# Patient Record
Sex: Male | Born: 1955 | Race: White | Hispanic: No | Marital: Married | Smoking: Never smoker
Health system: Southern US, Community
[De-identification: ages and names within clinical notes are randomized; demographics above are authoritative.]

## PROBLEM LIST (undated history)

## (undated) DIAGNOSIS — K635 Polyp of colon: Secondary | ICD-10-CM

## (undated) DIAGNOSIS — K5792 Diverticulitis of intestine, part unspecified, without perforation or abscess without bleeding: Secondary | ICD-10-CM

## (undated) DIAGNOSIS — C801 Malignant (primary) neoplasm, unspecified: Secondary | ICD-10-CM

## (undated) DIAGNOSIS — R011 Cardiac murmur, unspecified: Secondary | ICD-10-CM

## (undated) DIAGNOSIS — C61 Malignant neoplasm of prostate: Secondary | ICD-10-CM

## (undated) DIAGNOSIS — K219 Gastro-esophageal reflux disease without esophagitis: Secondary | ICD-10-CM

## (undated) DIAGNOSIS — K759 Inflammatory liver disease, unspecified: Secondary | ICD-10-CM

## (undated) DIAGNOSIS — R519 Headache, unspecified: Secondary | ICD-10-CM

## (undated) HISTORY — DX: Diverticulitis of intestine, part unspecified, without perforation or abscess without bleeding: K57.92

## (undated) HISTORY — PX: MELANOMA EXCISION: SHX5266

## (undated) HISTORY — PX: OTHER SURGICAL HISTORY: SHX169

## (undated) HISTORY — PX: COLONOSCOPY: SHX174

## (undated) HISTORY — PX: WISDOM TOOTH EXTRACTION: SHX21

## (undated) HISTORY — DX: Headache, unspecified: R51.9

## (undated) HISTORY — DX: Polyp of colon: K63.5

---

## 1998-08-06 ENCOUNTER — Inpatient Hospital Stay (HOSPITAL_COMMUNITY): Admission: EM | Admit: 1998-08-06 | Discharge: 1998-08-08 | Payer: Self-pay | Admitting: Emergency Medicine

## 1998-08-06 ENCOUNTER — Encounter: Payer: Self-pay | Admitting: *Deleted

## 1998-08-08 ENCOUNTER — Encounter: Payer: Self-pay | Admitting: Cardiology

## 2006-12-08 ENCOUNTER — Ambulatory Visit: Payer: Self-pay | Admitting: Pulmonary Disease

## 2014-05-22 ENCOUNTER — Encounter (HOSPITAL_COMMUNITY): Admission: RE | Payer: Self-pay | Source: Ambulatory Visit

## 2014-05-22 ENCOUNTER — Inpatient Hospital Stay (HOSPITAL_COMMUNITY): Admission: RE | Admit: 2014-05-22 | Payer: Self-pay | Source: Ambulatory Visit | Admitting: Urology

## 2014-05-22 SURGERY — ROBOTIC ASSISTED LAPAROSCOPIC RADICAL PROSTATECTOMY LEVEL 1
Anesthesia: General

## 2014-07-01 ENCOUNTER — Other Ambulatory Visit: Payer: Self-pay | Admitting: Urology

## 2014-07-22 ENCOUNTER — Inpatient Hospital Stay (HOSPITAL_COMMUNITY): Admission: RE | Admit: 2014-07-22 | Payer: Self-pay | Source: Ambulatory Visit

## 2014-07-22 NOTE — H&P (Signed)
  History of Present Illness Mr. Cristian Lewis is a 59 year old gentleman who was found to have an elevated PSA of 4.37 prompting a prostate needle biopsy on 12/25/13 by Dr. Karsten Ro. This revealed Gleason 3+3=6 adenocarcinoma of the prostate in 4 out of 12 biopsy cores. He has a brother with a history of prostate cancer. He has considered his options and has elected to proceed with surgical therapy. He has minimal medical comorbidities.    ** Notably, he is the primary caretaker for his wife who is currently ill with dialysis-dependent end-stage renal disease. She also has had issues related to calciphylaxis and is currently undergoing wound care for a lower extremity wound. She is completely dependent on her husband for her care including her wound care and transportation. 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 He would like to resume work as soon as possible after surgery. This is extremely important to him.    TNM stage: cT1c Nx Mx  PSA: 7.77  Gleason score: 3+3=6  Biopsy (12/25/13): 4/12 cores positive -- L lateral base (30%), R mid (5%), R base (80%), R lateral base (70%)  Prostate volume: 21.7 cc  PSAD: 0.20    Nomogram  OC disease: 58%  EPE: 42%  SVI: 1%  LNI: 1%  PFS (surgery): 95% at 5 years, 91% at 10 years    Urinary function: He has minimal lower urinary tract symptoms. IPSS is 1.   Erectile function: He is not sexually active due to his wife's current medical problems. He therefore is unable to provide a very accurate description of his erectile function. SHIM score is 3 but this is mostly related to inactivity.        Past Medical History Problems  1. History of Diverticulitis (K57.92) 2. History of colonic polyps (Z86.010) 3. History of eczema (Z87.2) 4. History of malignant melanoma (S28.315)  Surgical History Problems  1. History of No Surgical Problems  Current Meds 1. No Reported Medications Recorded  Allergies Medication  1.  HydrOXYzine HCl TABS 2. Sulfa Drugs  Family History Problems  1. Family history of lung cancer (Z80.1) : Mother 2. Family history of lymphoma (Z80.2) : Father 3. Family history of prostate cancer (Z80.42) : Brother 4. Family history of skin cancer (Z80.8) : Father  Social History Problems  1. Alcohol use (Z78.9)   Wine 1 per week 2. Married 3. Never a smoker   Physical Exam Constitutional: Well nourished and well developed . No acute distress.  ENT:. The ears and nose are normal in appearance.  Neck: The appearance of the neck is normal and no neck mass is present.  Pulmonary: No respiratory distress, normal respiratory rhythm and effort and clear bilateral breath sounds.  Abdomen: The abdomen is soft and nontender. No masses are palpated. No CVA tenderness. No hernias are      Discussion/Summary 1. Prostate cancer: He has chosen to proceed with surgical therapy and will undergo a RAL radical prostatectomy.

## 2014-07-22 NOTE — Patient Instructions (Signed)
Cristian Lewis  07/22/2014   Your procedure is scheduled on:     07/24/2014    Report to [facility] Main  Entrance take [place]  elevators to 3rd floor to  [place] at     0900 AM.  Call this number if you have problems the morning of surgery [phone]   Remember: ONLY 1 PERSON MAY GO WITH YOU TO SHORT STAY TO GET  READY MORNING OF [place].  Do not eat food or drink liquids :After Midnight.     Take these medicines the morning of surgery with A SIP OF WATER: none                                You may not have any metal on your body including hair pins and              piercings  Do not wear jewelry, , lotions, powders or perfumes, deodorant              Men may shave face and neck.   Do not bring valuables to the hospital. [place].  Contacts, dentures or bridgework may not be worn into surgery.  Leave suitcase in the car. After surgery it may be brought to your room.       Special Instructions: coughing and deep breathing exercises, leg exercises              Please read over the following fact sheets you were given: _____________________________________________________________________             [facility] - Preparing for Surgery Before surgery, you can play an important role.  Because skin is not sterile, your skin needs to be as free of germs as possible.  You can reduce the number of germs on your skin by washing with CHG (chlorahexidine gluconate) soap before surgery.  CHG is an antiseptic cleaner which kills germs and bonds with the skin to continue killing germs even after washing. Please DO NOT use if you have an allergy to CHG or antibacterial soaps.  If your skin becomes reddened/irritated stop using the CHG and inform your nurse when you arrive at Short Stay. Do not shave (including legs and underarms) for at least 48 hours prior to the first CHG shower.  You may shave your  face/neck. Please follow these instructions carefully:  1.  Shower with CHG Soap the night before surgery and the  morning of Surgery.  2.  If you choose to wash your hair, wash your hair first as usual with your  normal  shampoo.  3.  After you shampoo, rinse your hair and body thoroughly to remove the  shampoo.                           4.  Use CHG as you would any other liquid soap.  You can apply chg directly  to the skin and wash                       Gently with a scrungie or clean washcloth.  5.  Apply the CHG Soap to your body ONLY FROM THE NECK DOWN.  Do not use on face/ open                           Wound or open sores. Avoid contact with eyes, ears mouth and genitals (private parts).                       Wash face,  Genitals (private parts) with your normal soap.             6.  Wash thoroughly, paying special attention to the area where your surgery  will be performed.  7.  Thoroughly rinse your body with warm water from the neck down.  8.  DO NOT shower/wash with your normal soap after using and rinsing off  the CHG Soap.                9.  Pat yourself dry with a clean towel.            10.  Wear clean pajamas.            11.  Place clean sheets on your bed the night of your first shower and do not  sleep with pets. Day of Surgery : Do not apply any lotions/deodorants the morning of surgery.  Please wear clean clothes to the hospital/surgery center.  FAILURE TO FOLLOW THESE INSTRUCTIONS MAY RESULT IN THE CANCELLATION OF YOUR SURGERY PATIENT SIGNATURE_________________________________  NURSE SIGNATURE__________________________________  ________________________________________________________________________  WHAT IS A BLOOD TRANSFUSION? Blood Transfusion Information  A transfusion is the replacement of blood or some of its parts. Blood is made up of multiple cells which provide different functions.  Red blood cells carry oxygen and are used for blood loss  replacement.  White blood cells fight against infection.  Platelets control bleeding.  Plasma helps clot blood.  Other blood products are available for specialized needs, such as hemophilia or other clotting disorders. BEFORE THE TRANSFUSION  Who gives blood for transfusions?   Healthy volunteers who are fully evaluated to make sure their blood is safe. This is blood bank blood. Transfusion therapy is the safest it has ever been in the practice of medicine. Before blood is taken from a donor, a complete history is taken to make sure that person has no history of diseases nor engages in risky social behavior (examples are intravenous drug use or sexual activity with multiple partners). The donor's travel history is screened to minimize risk of transmitting infections, such as malaria. The donated blood is tested for signs of infectious diseases, such as HIV and hepatitis. The blood is then tested to be sure it is compatible with you in order to minimize the chance of a transfusion reaction. If you or a relative donates blood, this is often done in anticipation of surgery and is not appropriate for emergency situations. It takes many days to process the donated blood. RISKS AND COMPLICATIONS Although transfusion therapy is very safe and saves many lives, the main dangers of transfusion include:   Getting an infectious disease.  Developing a transfusion reaction. This is an allergic reaction to something in the blood you were given. Every precaution is taken to prevent this. The decision to have a blood transfusion has been considered carefully by your caregiver before blood is given. Blood is not given unless the benefits outweigh the risks. AFTER THE TRANSFUSION  Right after receiving a blood transfusion, you will usually feel much better and more energetic. This is especially  true if your red blood cells have gotten low (anemic). The transfusion raises the level of the red blood cells which  carry oxygen, and this usually causes an energy increase.  The nurse administering the transfusion will monitor you carefully for complications. HOME CARE INSTRUCTIONS  No special instructions are needed after a transfusion. You may find your energy is better. Speak with your caregiver about any limitations on activity for underlying diseases you may have. SEEK MEDICAL CARE IF:   Your condition is not improving after your transfusion.  You develop redness or irritation at the intravenous (IV) site. SEEK IMMEDIATE MEDICAL CARE IF:  Any of the following symptoms occur over the next 12 hours:  Shaking chills.  You have a temperature by mouth above 102 F (38.9 C), not controlled by medicine.  Chest, back, or muscle pain.  People around you feel you are not acting correctly or are confused.  Shortness of breath or difficulty breathing.  Dizziness and fainting.  You get a rash or develop hives.  You have a decrease in urine output.  Your urine turns a dark color or changes to pink, red, or brown. Any of the following symptoms occur over the next 10 days:  You have a temperature by mouth above 102 F (38.9 C), not controlled by medicine.  Shortness of breath.  Weakness after normal activity.  The white part of the eye turns yellow (jaundice).  You have a decrease in the amount of urine or are urinating less often.  Your urine turns a dark color or changes to pink, red, or brown. Document Released: 01/08/2000 Document Revised: 04/04/2011 Document Reviewed: 08/27/2007 [facility] Patient Information 2014 [place], [place].  _______________________________________________________________________

## 2014-07-23 ENCOUNTER — Encounter (HOSPITAL_COMMUNITY)
Admission: RE | Admit: 2014-07-23 | Discharge: 2014-07-23 | Disposition: A | Discharge status: Home / Self Care | Payer: Federal, State, Local not specified - PPO | Source: Ambulatory Visit | Attending: Urology | Admitting: Urology

## 2014-07-23 ENCOUNTER — Ambulatory Visit (HOSPITAL_COMMUNITY)
Admission: RE | Admit: 2014-07-23 | Discharge: 2014-07-23 | Disposition: A | Discharge status: Home / Self Care | Payer: Federal, State, Local not specified - PPO | Source: Ambulatory Visit | Attending: Anesthesiology | Admitting: Anesthesiology

## 2014-07-23 ENCOUNTER — Encounter (HOSPITAL_COMMUNITY): Payer: Self-pay

## 2014-07-23 DIAGNOSIS — Z01818 Encounter for other preprocedural examination: Secondary | ICD-10-CM

## 2014-07-23 HISTORY — DX: Gastro-esophageal reflux disease without esophagitis: K21.9

## 2014-07-23 HISTORY — DX: Cardiac murmur, unspecified: R01.1

## 2014-07-23 HISTORY — DX: Inflammatory liver disease, unspecified: K75.9

## 2014-07-23 LAB — CBC
HCT: 46.2 % (ref 39.0–52.0)
Hemoglobin: 15.3 g/dL (ref 13.0–17.0)
MCH: 29.5 pg (ref 26.0–34.0)
MCHC: 33.1 g/dL (ref 30.0–36.0)
MCV: 89.2 fL (ref 78.0–100.0)
PLATELETS: 243 10*3/uL (ref 150–400)
RBC: 5.18 MIL/uL (ref 4.22–5.81)
RDW: 13.2 % (ref 11.5–15.5)
WBC: 5.9 10*3/uL (ref 4.0–10.5)

## 2014-07-23 LAB — ABO/RH: ABO/RH(D): O POS

## 2014-07-23 LAB — HEPATIC FUNCTION PANEL
ALK PHOS: 40 U/L (ref 38–126)
ALT: 17 U/L (ref 17–63)
AST: 19 U/L (ref 15–41)
Albumin: 4.3 g/dL (ref 3.5–5.0)
Total Bilirubin: 0.7 mg/dL (ref 0.3–1.2)
Total Protein: 7.2 g/dL (ref 6.5–8.1)

## 2014-07-23 LAB — BASIC METABOLIC PANEL
ANION GAP: 6 (ref 5–15)
BUN: 15 mg/dL (ref 6–20)
CO2: 31 mmol/L (ref 22–32)
CREATININE: 0.95 mg/dL (ref 0.61–1.24)
Calcium: 9.4 mg/dL (ref 8.9–10.3)
Chloride: 102 mmol/L (ref 101–111)
GFR calc Af Amer: >60 mL/min (ref >60)
Glucose, Bld: 98 mg/dL (ref 65–99)
Potassium: 4.3 mmol/L (ref 3.5–5.1)
SODIUM: 139 mmol/L (ref 135–145)

## 2014-07-23 NOTE — Progress Notes (Signed)
CXR doen 07/23/2014 faxed via EPIC to Dr Alinda Money.

## 2014-07-23 NOTE — Progress Notes (Signed)
Final EKG done 07/23/2014 in EPIC.

## 2014-07-24 ENCOUNTER — Inpatient Hospital Stay (HOSPITAL_COMMUNITY): Payer: Federal, State, Local not specified - PPO | Admitting: Certified Registered Nurse Anesthetist

## 2014-07-24 ENCOUNTER — Encounter (HOSPITAL_COMMUNITY): Payer: Self-pay | Admitting: Certified Registered Nurse Anesthetist

## 2014-07-24 ENCOUNTER — Encounter (HOSPITAL_COMMUNITY)
Admission: RE | Disposition: A | Discharge status: Home / Self Care | Payer: Self-pay | Source: Ambulatory Visit | Attending: Urology

## 2014-07-24 ENCOUNTER — Inpatient Hospital Stay (HOSPITAL_COMMUNITY)
Admission: RE | Admit: 2014-07-24 | Discharge: 2014-07-25 | DRG: 708 | Disposition: A | Discharge status: Home / Self Care | Payer: Federal, State, Local not specified - PPO | Source: Ambulatory Visit | Attending: Urology | Admitting: Urology

## 2014-07-24 DIAGNOSIS — C61 Malignant neoplasm of prostate: Principal | ICD-10-CM | POA: Diagnosis present

## 2014-07-24 DIAGNOSIS — Z85820 Personal history of malignant melanoma of skin: Secondary | ICD-10-CM | POA: Diagnosis not present

## 2014-07-24 DIAGNOSIS — Z86010 Personal history of colonic polyps: Secondary | ICD-10-CM

## 2014-07-24 HISTORY — PX: ROBOT ASSISTED LAPAROSCOPIC RADICAL PROSTATECTOMY: SHX5141

## 2014-07-24 HISTORY — DX: Malignant (primary) neoplasm, unspecified: C80.1

## 2014-07-24 LAB — HEMOGLOBIN AND HEMATOCRIT, BLOOD
HEMATOCRIT: 40.1 % (ref 39.0–52.0)
HEMOGLOBIN: 13.7 g/dL (ref 13.0–17.0)

## 2014-07-24 LAB — TYPE AND SCREEN
ABO/RH(D): O POS
Antibody Screen: NEGATIVE

## 2014-07-24 SURGERY — ROBOTIC ASSISTED LAPAROSCOPIC RADICAL PROSTATECTOMY LEVEL 1
Anesthesia: General

## 2014-07-24 MED ORDER — SODIUM CHLORIDE 0.9 % IV BOLUS (SEPSIS)
1000.0000 mL | Freq: Once | INTRAVENOUS | Status: AC
  Administered 2014-07-24: 1000 mL via INTRAVENOUS

## 2014-07-24 MED ORDER — PROPOFOL 10 MG/ML IV BOLUS
INTRAVENOUS | Status: AC
  Filled 2014-07-24: qty 20

## 2014-07-24 MED ORDER — LACTATED RINGERS IV SOLN
INTRAVENOUS | Status: DC | PRN
  Administered 2014-07-24 (×2): via INTRAVENOUS

## 2014-07-24 MED ORDER — PROPOFOL 10 MG/ML IV BOLUS
INTRAVENOUS | Status: DC | PRN
  Administered 2014-07-24: 160 mg via INTRAVENOUS

## 2014-07-24 MED ORDER — NEOSTIGMINE METHYLSULFATE 10 MG/10ML IV SOLN
INTRAVENOUS | Status: DC | PRN
  Administered 2014-07-24: 5 mg via INTRAVENOUS

## 2014-07-24 MED ORDER — HYDROMORPHONE HCL 1 MG/ML IJ SOLN
INTRAMUSCULAR | Status: AC
  Filled 2014-07-24: qty 1

## 2014-07-24 MED ORDER — EPHEDRINE SULFATE 50 MG/ML IJ SOLN
INTRAMUSCULAR | Status: DC | PRN
  Administered 2014-07-24 (×3): 10 mg via INTRAVENOUS

## 2014-07-24 MED ORDER — BUPIVACAINE-EPINEPHRINE 0.25% -1:200000 IJ SOLN
INTRAMUSCULAR | Status: DC | PRN
  Administered 2014-07-24: 30 mL

## 2014-07-24 MED ORDER — ROCURONIUM BROMIDE 100 MG/10ML IV SOLN
INTRAVENOUS | Status: DC | PRN
  Administered 2014-07-24 (×4): 10 mg via INTRAVENOUS
  Administered 2014-07-24: 50 mg via INTRAVENOUS

## 2014-07-24 MED ORDER — ONDANSETRON HCL 4 MG/2ML IJ SOLN
INTRAMUSCULAR | Status: AC
  Filled 2014-07-24: qty 2

## 2014-07-24 MED ORDER — LIDOCAINE HCL (CARDIAC) 20 MG/ML IV SOLN
INTRAVENOUS | Status: AC
Start: 2014-07-24 — End: 2014-07-24
  Filled 2014-07-24: qty 5

## 2014-07-24 MED ORDER — MIDAZOLAM HCL 5 MG/5ML IJ SOLN
INTRAMUSCULAR | Status: DC | PRN
  Administered 2014-07-24: 2 mg via INTRAVENOUS

## 2014-07-24 MED ORDER — FENTANYL CITRATE (PF) 100 MCG/2ML IJ SOLN
INTRAMUSCULAR | Status: DC | PRN
  Administered 2014-07-24: 100 ug via INTRAVENOUS
  Administered 2014-07-24: 50 ug via INTRAVENOUS
  Administered 2014-07-24: 100 ug via INTRAVENOUS
  Administered 2014-07-24: 50 ug via INTRAVENOUS

## 2014-07-24 MED ORDER — DOCUSATE SODIUM 100 MG PO CAPS
100.0000 mg | ORAL_CAPSULE | Freq: Two times a day (BID) | ORAL | Status: DC
Start: 2014-07-24 — End: 2014-10-15

## 2014-07-24 MED ORDER — ACETAMINOPHEN 325 MG PO TABS: 650.0000 mg | ORAL_TABLET | ORAL | Status: DC | PRN

## 2014-07-24 MED ORDER — ROCURONIUM BROMIDE 100 MG/10ML IV SOLN
INTRAVENOUS | Status: AC
  Filled 2014-07-24: qty 2

## 2014-07-24 MED ORDER — FENTANYL CITRATE (PF) 250 MCG/5ML IJ SOLN
INTRAMUSCULAR | Status: AC
Start: 2014-07-24 — End: 2014-07-24
  Filled 2014-07-24: qty 5

## 2014-07-24 MED ORDER — BUPIVACAINE-EPINEPHRINE (PF) 0.25% -1:200000 IJ SOLN
INTRAMUSCULAR | Status: AC
  Filled 2014-07-24: qty 30

## 2014-07-24 MED ORDER — KCL IN DEXTROSE-NACL 20-5-0.45 MEQ/L-%-% IV SOLN
INTRAVENOUS | Status: DC
  Administered 2014-07-24: 150 mL/h via INTRAVENOUS
  Administered 2014-07-24 – 2014-07-25 (×2): via INTRAVENOUS
  Filled 2014-07-24 (×4): qty 1000

## 2014-07-24 MED ORDER — DIPHENHYDRAMINE HCL 50 MG/ML IJ SOLN: 12.5000 mg | Freq: Four times a day (QID) | INTRAMUSCULAR | Status: DC | PRN

## 2014-07-24 MED ORDER — HYDROCODONE-ACETAMINOPHEN 5-325 MG PO TABS: 1.0000 | ORAL_TABLET | Freq: Four times a day (QID) | ORAL | Status: DC | PRN

## 2014-07-24 MED ORDER — KETOROLAC TROMETHAMINE 15 MG/ML IJ SOLN
15.0000 mg | Freq: Four times a day (QID) | INTRAMUSCULAR | Status: DC
  Administered 2014-07-24 – 2014-07-25 (×4): 15 mg via INTRAVENOUS
  Filled 2014-07-24 (×8): qty 1

## 2014-07-24 MED ORDER — GLYCOPYRROLATE 0.2 MG/ML IJ SOLN
INTRAMUSCULAR | Status: DC | PRN
  Administered 2014-07-24: .8 mg via INTRAVENOUS

## 2014-07-24 MED ORDER — HEPARIN SODIUM (PORCINE) 1000 UNIT/ML IJ SOLN
INTRAMUSCULAR | Status: AC
  Filled 2014-07-24: qty 1

## 2014-07-24 MED ORDER — FENTANYL CITRATE (PF) 100 MCG/2ML IJ SOLN
INTRAMUSCULAR | Status: AC
  Filled 2014-07-24: qty 2

## 2014-07-24 MED ORDER — HYDROMORPHONE HCL 1 MG/ML IJ SOLN
0.2500 mg | INTRAMUSCULAR | Status: AC | PRN
  Administered 2014-07-24: 0.5 mg via INTRAVENOUS
  Administered 2014-07-24: 0.25 mg via INTRAVENOUS
  Administered 2014-07-24 (×3): 0.5 mg via INTRAVENOUS
  Administered 2014-07-24 (×3): 0.25 mg via INTRAVENOUS

## 2014-07-24 MED ORDER — MENTHOL 3 MG MT LOZG
1.0000 | LOZENGE | OROMUCOSAL | Status: DC | PRN
  Administered 2014-07-25: 3 mg via ORAL
  Filled 2014-07-24: qty 9

## 2014-07-24 MED ORDER — DOCUSATE SODIUM 100 MG PO CAPS
100.0000 mg | ORAL_CAPSULE | Freq: Two times a day (BID) | ORAL | Status: DC
  Administered 2014-07-24 – 2014-07-25 (×2): 100 mg via ORAL
  Filled 2014-07-24 (×4): qty 1

## 2014-07-24 MED ORDER — MORPHINE SULFATE 2 MG/ML IJ SOLN
2.0000 mg | INTRAMUSCULAR | Status: DC | PRN
  Administered 2014-07-24 – 2014-07-25 (×2): 2 mg via INTRAVENOUS
  Filled 2014-07-24 (×2): qty 1

## 2014-07-24 MED ORDER — LACTATED RINGERS IV SOLN
INTRAVENOUS | Status: DC
  Administered 2014-07-24: 15:00:00 via INTRAVENOUS

## 2014-07-24 MED ORDER — MIDAZOLAM HCL 2 MG/2ML IJ SOLN
INTRAMUSCULAR | Status: AC
  Filled 2014-07-24: qty 2

## 2014-07-24 MED ORDER — CEFAZOLIN SODIUM-DEXTROSE 2-3 GM-% IV SOLR
2.0000 g | INTRAVENOUS | Status: AC
  Administered 2014-07-24: 2 g via INTRAVENOUS

## 2014-07-24 MED ORDER — CEFAZOLIN SODIUM 1-5 GM-% IV SOLN
1.0000 g | Freq: Three times a day (TID) | INTRAVENOUS | Status: AC
  Administered 2014-07-24 – 2014-07-25 (×2): 1 g via INTRAVENOUS
  Filled 2014-07-24 (×2): qty 50

## 2014-07-24 MED ORDER — CIPROFLOXACIN HCL 500 MG PO TABS: 500.0000 mg | ORAL_TABLET | Freq: Two times a day (BID) | ORAL | Status: DC

## 2014-07-24 MED ORDER — HYDROMORPHONE HCL 1 MG/ML IJ SOLN
INTRAMUSCULAR | Status: AC
Start: 2014-07-24 — End: 2014-07-25
  Filled 2014-07-24: qty 1

## 2014-07-24 MED ORDER — ONDANSETRON HCL 4 MG/2ML IJ SOLN
4.0000 mg | Freq: Four times a day (QID) | INTRAMUSCULAR | Status: DC | PRN
  Filled 2014-07-24: qty 2

## 2014-07-24 MED ORDER — DIPHENHYDRAMINE HCL 12.5 MG/5ML PO ELIX: 12.5000 mg | ORAL_SOLUTION | Freq: Four times a day (QID) | ORAL | Status: DC | PRN

## 2014-07-24 MED ORDER — CEFAZOLIN SODIUM-DEXTROSE 2-3 GM-% IV SOLR
INTRAVENOUS | Status: AC
  Filled 2014-07-24: qty 50

## 2014-07-24 SURGICAL SUPPLY — 49 items
CABLE HIGH FREQUENCY MONO STRZ (ELECTRODE) ×2 IMPLANT
CATH FOLEY 2WAY SLVR 18FR 30CC (CATHETERS) ×2 IMPLANT
CATH ROBINSON RED A/P 16FR (CATHETERS) ×2 IMPLANT
CATH ROBINSON RED A/P 8FR (CATHETERS) ×2 IMPLANT
CATH TIEMANN FOLEY 18FR 5CC (CATHETERS) ×2 IMPLANT
CHLORAPREP W/TINT 26ML (MISCELLANEOUS) ×2 IMPLANT
CLIP LIGATING HEM O LOK PURPLE (MISCELLANEOUS) IMPLANT
CLOTH BEACON ORANGE TIMEOUT ST (SAFETY) ×2 IMPLANT
COVER SURGICAL LIGHT HANDLE (MISCELLANEOUS) ×2 IMPLANT
COVER TIP SHEARS 8 DVNC (MISCELLANEOUS) ×1 IMPLANT
COVER TIP SHEARS 8MM DA VINCI (MISCELLANEOUS) ×1
CUTTER ECHEON FLEX ENDO 45 340 (ENDOMECHANICALS) ×2 IMPLANT
DECANTER SPIKE VIAL GLASS SM (MISCELLANEOUS) IMPLANT
DRAPE SURG IRRIG POUCH 19X23 (DRAPES) ×2 IMPLANT
DRSG TEGADERM 4X4.75 (GAUZE/BANDAGES/DRESSINGS) ×2 IMPLANT
DRSG TEGADERM 6X8 (GAUZE/BANDAGES/DRESSINGS) ×4 IMPLANT
ELECT REM PT RETURN 9FT ADLT (ELECTROSURGICAL) ×2
ELECTRODE REM PT RTRN 9FT ADLT (ELECTROSURGICAL) ×1 IMPLANT
GLOVE BIO SURGEON STRL SZ 6.5 (GLOVE) ×2 IMPLANT
GLOVE BIOGEL M STRL SZ7.5 (GLOVE) ×4 IMPLANT
GOWN STRL REUS W/TWL LRG LVL3 (GOWN DISPOSABLE) ×8 IMPLANT
HEMOSTAT SURGICEL 2X3 (HEMOSTASIS) ×2 IMPLANT
HOLDER FOLEY CATH W/STRAP (MISCELLANEOUS) ×2 IMPLANT
IV LACTATED RINGERS 1000ML (IV SOLUTION) ×2 IMPLANT
KIT ACCESSORY DA VINCI DISP (KITS) ×1
KIT ACCESSORY DVNC DISP (KITS) ×1 IMPLANT
LIQUID BAND (GAUZE/BANDAGES/DRESSINGS) ×2 IMPLANT
NDL SAFETY ECLIPSE 18X1.5 (NEEDLE) ×1 IMPLANT
NEEDLE HYPO 18GX1.5 SHARP (NEEDLE) ×1
PACK ROBOT UROLOGY CUSTOM (CUSTOM PROCEDURE TRAY) ×2 IMPLANT
RELOAD GREEN ECHELON 45 (STAPLE) ×2 IMPLANT
SET TUBE IRRIG SUCTION NO TIP (IRRIGATION / IRRIGATOR) ×2 IMPLANT
SHEET LAVH (DRAPES) ×2 IMPLANT
SOLUTION ELECTROLUBE (MISCELLANEOUS) ×2 IMPLANT
SUT ETHILON 3 0 PS 1 (SUTURE) ×2 IMPLANT
SUT MNCRL 3 0 RB1 (SUTURE) ×1 IMPLANT
SUT MNCRL 3 0 VIOLET RB1 (SUTURE) ×1 IMPLANT
SUT MNCRL AB 4-0 PS2 18 (SUTURE) ×4 IMPLANT
SUT MONOCRYL 3 0 RB1 (SUTURE) ×2
SUT VIC AB 0 CT1 27 (SUTURE) ×1
SUT VIC AB 0 CT1 27XBRD ANTBC (SUTURE) ×1 IMPLANT
SUT VIC AB 0 UR5 27 (SUTURE) ×2 IMPLANT
SUT VIC AB 2-0 SH 27 (SUTURE) ×1
SUT VIC AB 2-0 SH 27X BRD (SUTURE) ×1 IMPLANT
SUT VICRYL 0 UR6 27IN ABS (SUTURE) ×4 IMPLANT
SYR 27GX1/2 1ML LL SAFETY (SYRINGE) ×2 IMPLANT
TOWEL OR 17X26 10 PK STRL BLUE (TOWEL DISPOSABLE) ×2 IMPLANT
TOWEL OR NON WOVEN STRL DISP B (DISPOSABLE) ×2 IMPLANT
WATER STERILE IRR 1500ML POUR (IV SOLUTION) ×4 IMPLANT

## 2014-07-24 NOTE — Anesthesia Postprocedure Evaluation (Signed)
  Anesthesia Post-op Note  Patient: Cristian Lewis  Procedure(s) Performed: Procedure(s) (LRB): ROBOTIC ASSISTED LAPAROSCOPIC RADICAL PROSTATECTOMY LEVEL 1 (N/A)  Patient Location: PACU  Anesthesia Type: General  Level of Consciousness: awake and alert   Airway and Oxygen Therapy: Patient Spontanous Breathing  Post-op Pain: mild  Post-op Assessment: Post-op Vital signs reviewed, Patient's Cardiovascular Status Stable, Respiratory Function Stable, Patent Airway and No signs of Nausea or vomiting  Last Vitals:  Filed Vitals:   07/24/14 1601  BP: 120/66  Pulse: 57  Temp: 36.7 C  Resp: 16    Post-op Vital Signs: stable   Complications: No apparent anesthesia complications

## 2014-07-24 NOTE — Op Note (Signed)
Preoperative diagnosis: Clinically localized adenocarcinoma of the prostate (clinical stage T1c Nx Mx)  Postoperative diagnosis: Clinically localized adenocarcinoma of the prostate (clinical stage T1c Nx Mx)  Procedure:  1. Robotic assisted laparoscopic radical prostatectomy (bilateral nerve sparing)  Surgeon: Roxy Horseman, Brooke Bonito. M.D.  Assistant: Dr. Verdis Frederickson  Anesthesia: General  Complications: None  EBL: 100 mL  IVF:  1500 mL crystalloid  Specimens: 1. Prostate and seminal vesicles  Disposition of specimens: Pathology  Drains: 1. 20 Fr coude catheter 2. # 19 Blake pelvic drain  Indication: Cristian Lewis is a 59 y.o. year old patient with clinically localized prostate cancer.  After a thorough review of the management options for treatment of prostate cancer, he elected to proceed with surgical therapy and the above procedure(s).  We have discussed the potential benefits and risks of the procedure, side effects of the proposed treatment, the likelihood of the patient achieving the goals of the procedure, and any potential problems that might occur during the procedure or recuperation. Informed consent has been obtained.  Description of procedure:  The patient was taken to the operating room and a general anesthetic was administered. He was given preoperative antibiotics, placed in the dorsal lithotomy position, and prepped and draped in the usual sterile fashion. Next a preoperative timeout was performed. A urethral catheter was placed into the bladder and a site was selected near the umbilicus for placement of the camera port. This was placed using a standard open Hassan technique which allowed entry into the peritoneal cavity under direct vision and without difficulty. A 12 mm port was placed and a pneumoperitoneum established. The camera was then used to inspect the abdomen and there was no evidence of any intra-abdominal injuries or other abnormalities. The remaining  abdominal ports were then placed. 8 mm robotic ports were placed in the right lower quadrant, left lower quadrant, and far left lateral abdominal wall. A 5 mm port was placed in the right upper quadrant and a 12 mm port was placed in the right lateral abdominal wall for laparoscopic assistance. All ports were placed under direct vision without difficulty. The surgical cart was then docked.   Utilizing the cautery scissors, the bladder was reflected posteriorly allowing entry into the space of Retzius and identification of the endopelvic fascia and prostate. The periprostatic fat was then removed from the prostate allowing full exposure of the endopelvic fascia. The endopelvic fascia was then incised from the apex back to the base of the prostate bilaterally and the underlying levator muscle fibers were swept laterally off the prostate thereby isolating the dorsal venous complex. The dorsal vein was then stapled and divided with a 45 mm Flex Echelon stapler. Attention then turned to the bladder neck which was divided anteriorly thereby allowing entry into the bladder and exposure of the urethral catheter. The catheter balloon was deflated and the catheter was brought into the operative field and used to retract the prostate anteriorly. The posterior bladder neck was then examined and was divided allowing further dissection between the bladder and prostate posteriorly until the vasa deferentia and seminal vessels were identified. The vasa deferentia were isolated, divided, and lifted anteriorly. The seminal vesicles were dissected down to their tips with care to control the seminal vascular arterial blood supply. These structures were then lifted anteriorly and the space between Denonvillier's fascia and the anterior rectum was developed with a combination of sharp and blunt dissection. This isolated the vascular pedicles of the prostate.  The lateral prostatic  fascia was then sharply incised allowing release of  the neurovascular bundles bilaterally. The vascular pedicles of the prostate were then ligated with Weck clips between the prostate and neurovascular bundles and divided with sharp cold scissor dissection resulting in neurovascular bundle preservation. The neurovascular bundles were then separated off the apex of the prostate and urethra bilaterally.  The urethra was then sharply transected allowing the prostate specimen to be disarticulated. The pelvis was copiously irrigated and hemostasis was ensured. There was no evidence for rectal injury.  Attention then turned to the urethral anastomosis. A 2-0 Vicryl slip knot was placed between Denonvillier's fascia, the posterior bladder neck, and the posterior urethra to reapproximate these structures. A double-armed 3-0 Monocryl suture was then used to perform a 360 running tension-free anastomosis between the bladder neck and urethra. A new urethral catheter was then placed into the bladder and irrigated. There were no blood clots within the bladder and the anastomosis appeared to be watertight. A #19 Blake drain was then brought through the left lateral 8 mm port site and positioned appropriately within the pelvis. It was secured to the skin with a nylon suture. The surgical cart was then undocked. The right lateral 12 mm port site was closed at the fascial level with a 0 Vicryl suture placed laparoscopically. All remaining ports were then removed under direct vision. The prostate specimen was removed intact within the Endopouch retrieval bag via the periumbilical camera port site. This fascial opening was closed with two running 0 Vicryl sutures. 0.25% Marcaine was then injected into all port sites and all incisions were reapproximated at the skin level with 4-0 Monocryl subcuticular sutures and Dermabond. The patient appeared to tolerate the procedure well and without complications. The patient was able to be extubated and transferred to the recovery unit in  satisfactory condition.  Pryor Curia MD

## 2014-07-24 NOTE — Anesthesia Procedure Notes (Signed)
Procedure Name: Intubation Performed by: Gean Maidens Pre-anesthesia Checklist: Patient identified, Emergency Drugs available, Patient being monitored, Timeout performed and Suction available Patient Re-evaluated:Patient Re-evaluated prior to inductionOxygen Delivery Method: Circle system utilized Preoxygenation: Pre-oxygenation with 100% oxygen Intubation Type: IV induction Ventilation: Mask ventilation without difficulty Laryngoscope Size: Mac and 3 Grade View: Grade I Tube type: Oral Tube size: 8.0 mm Number of attempts: 1 Airway Equipment and Method: Stylet Placement Confirmation: ETT inserted through vocal cords under direct vision,  positive ETCO2,  CO2 detector and breath sounds checked- equal and bilateral Secured at: 23 cm Tube secured with: Tape Dental Injury: Teeth and Oropharynx as per pre-operative assessment

## 2014-07-24 NOTE — Progress Notes (Signed)
Dr. Alinda Money notified that patient did not take Magnesium Citrate 07/23/14 but did have clear liquids and he did do fleet enema

## 2014-07-24 NOTE — Transfer of Care (Signed)
Immediate Anesthesia Transfer of Care Note  Patient: Cristian Lewis  Procedure(s) Performed: Procedure(s): ROBOTIC ASSISTED LAPAROSCOPIC RADICAL PROSTATECTOMY LEVEL 1 (N/A)  Patient Location: PACU  Anesthesia Type:General  Level of Consciousness: awake and alert   Airway & Oxygen Therapy: Patient Spontanous Breathing and Patient connected to face mask oxygen  Post-op Assessment: Report given to RN and Post -op Vital signs reviewed and stable  Post vital signs: Reviewed and stable  Last Vitals:  Filed Vitals:   07/24/14 0858  BP: 117/86  Pulse: 50  Temp: 36.3 C  Resp: 16    Complications: No apparent anesthesia complications

## 2014-07-24 NOTE — Discharge Instructions (Signed)

## 2014-07-24 NOTE — Progress Notes (Signed)
Patient ID: Cristian Lewis, male   DOB: 06/29/1955, 59 y.o.   MRN: [id number]  Post-op note  Subjective: The patient is doing well.  No complaints except some rectal pressure.  Objective: Vital signs in last 24 hours: Temp:  [97.4 F (36.3 C)-98.1 F (36.7 C)] 97.7 F (36.5 C) (06/30 2042) Pulse Rate:  [50-104] 104 (06/30 2042) Resp:  [10-26] 18 (06/30 2042) BP: (89-135)/(65-86) 120/74 mmHg (06/30 2042) SpO2:  [98 %-100 %] 98 % (06/30 2042) Weight:  [100.245 kg (221 lb)] 100.245 kg (221 lb) (06/30 0925)  Intake/Output from previous day:   Intake/Output this shift: Total I/O In: -  Out: 10 [Drains:10]  Physical Exam:  General: Alert and oriented. Abdomen: Soft, Nondistended. Incisions: Clean and dry. GU: Urine draining well.  Lab Results:  Recent Labs  07/23/14 0830 07/24/14 1445  HGB 15.3 13.7  HCT 46.2 40.1    Assessment/Plan: POD#0   1) Continue to monitor, ambulate, IS   Pryor Curia. MD   LOS: 0 days   Athalia Setterlund,LES 07/24/2014, 10:17 PM

## 2014-07-24 NOTE — Anesthesia Preprocedure Evaluation (Signed)
Anesthesia Evaluation  Patient identified by MRN, date of birth, ID band Patient awake    Reviewed: Allergy & Precautions, H&P , NPO status , Patient's Chart, lab work & pertinent test results  Airway Mallampati: II  TM Distance: >3 FB Neck ROM: full    Dental no notable dental hx. (+) Dental Advisory Given   Pulmonary neg pulmonary ROS,  breath sounds clear to auscultation  Pulmonary exam normal       Cardiovascular Exercise Tolerance: Good negative cardio ROS Normal cardiovascular examRhythm:regular Rate:Normal     Neuro/Psych negative neurological ROS  negative psych ROS   GI/Hepatic negative GI ROS, Neg liver ROS,   Endo/Other  negative endocrine ROS  Renal/GU negative Renal ROS  negative genitourinary   Musculoskeletal   Abdominal   Peds  Hematology negative hematology ROS (+)   Anesthesia Other Findings   Reproductive/Obstetrics negative OB ROS                             Anesthesia Physical Anesthesia Plan  ASA: II  Anesthesia Plan: General   Post-op Pain Management:    Induction: Intravenous  Airway Management Planned: Oral ETT  Additional Equipment:   Intra-op Plan:   Post-operative Plan: Extubation in OR  Informed Consent: I have reviewed the patients History and Physical, chart, labs and discussed the procedure including the risks, benefits and alternatives for the proposed anesthesia with the patient or authorized representative who has indicated his/her understanding and acceptance.   Dental Advisory Given  Plan Discussed with: CRNA and Surgeon  Anesthesia Plan Comments:         Anesthesia Quick Evaluation

## 2014-07-25 ENCOUNTER — Encounter (HOSPITAL_COMMUNITY): Payer: Self-pay | Admitting: Urology

## 2014-07-25 LAB — HEMOGLOBIN AND HEMATOCRIT, BLOOD
HEMATOCRIT: 38.5 % — AB (ref 39.0–52.0)
Hemoglobin: 13 g/dL (ref 13.0–17.0)

## 2014-07-25 MED ORDER — VITAMINS A & D EX OINT
TOPICAL_OINTMENT | CUTANEOUS | Status: AC
  Administered 2014-07-25: 5
  Filled 2014-07-25: qty 5

## 2014-07-25 MED ORDER — BISACODYL 10 MG RE SUPP
10.0000 mg | Freq: Once | RECTAL | Status: AC
  Administered 2014-07-25: 10 mg via RECTAL
  Filled 2014-07-25: qty 1

## 2014-07-25 MED ORDER — HYDROCODONE-ACETAMINOPHEN 5-325 MG PO TABS
1.0000 | ORAL_TABLET | Freq: Four times a day (QID) | ORAL | Status: DC | PRN
  Administered 2014-07-25: 1 via ORAL
  Filled 2014-07-25: qty 2

## 2014-07-25 NOTE — Progress Notes (Signed)
1 Day Post-Op Subjective: The patient is doing well.  No nausea or vomiting. Pain is adequately controlled. Minimal JP drain output.  Objective: Vital signs in last 24 hours: Temp:  [97.4 F (36.3 C)-98.1 F (36.7 C)] 98 F (36.7 C) (07/01 0426) Pulse Rate:  [50-104] 55 (07/01 0426) Resp:  [10-26] 18 (07/01 0426) BP: (89-135)/(56-86) 91/56 mmHg (07/01 0426) SpO2:  [98 %-100 %] 98 % (07/01 0426) Weight:  [100.245 kg (221 lb)] 100.245 kg (221 lb) (06/30 0925)  Intake/Output from previous day: 06/30 0701 - 07/01 0700 In: 3737.5 [I.V.:3687.5; IV Piggyback:50] Out: 8309 [Urine:1050; Drains:55; Blood:100] Intake/Output this shift:    Physical Exam:  General: Alert and oriented. CV: RRR Lungs: Clear bilaterally. GI: Soft, Nondistended. Incisions: Clean, dry, and intact Urine: Clear Extremities: Nontender, no erythema, no edema.  Lab Results:  Recent Labs  07/23/14 0830 07/24/14 1445 07/25/14 0418  HGB 15.3 13.7 13.0  HCT 46.2 40.1 38.5*      Assessment/Plan: POD# 1 s/p robotic prostatectomy. Doing well.  1) SL IVF 2) Ambulate, Incentive spirometry 3) Transition to oral pain medication 4) Dulcolax suppository 5) D/C pelvic drain 6) Plan for likely discharge later today    LOS: 1 day   Cristian Lewis 07/25/2014, 7:11 AM

## 2014-07-25 NOTE — Progress Notes (Signed)
Utilization review completed.  

## 2014-07-25 NOTE — Discharge Summary (Signed)
Urology Discharge Summary  Admit date: 07/24/2014  Discharge date and time: 07/25/2014  Discharge to:  Home  Discharge Service: Urology  Discharge Attending Physician: Raynelle Bring, MD  Discharge  Diagnoses: Prostate Cancer  Secondary Diagnosis: Active Problems:   Prostate cancer   OR Procedures: Procedure(s): ROBOTIC ASSISTED LAPAROSCOPIC RADICAL PROSTATECTOMY LEVEL 1 07/24/2014   Ancillary Procedures: none  Discharge Day Services: The patient was seen and examined by the Urology team both in the morning and immediately prior to discharge.  Vital signs and laboratory values were stable and within normal limits.  The physical exam was benign and unchanged and all surgical wounds were examined.  Discharge instructions were explained and all questions answered.  Subjective  NAEON. Pain controlled. JP drain removed prior to discharge.  Objective Patient Vitals for the past 8 hrs:  BP Temp Temp src Pulse Resp SpO2  07/25/14 0426 (!) 91/56 mmHg 98 F (36.7 C) Oral (!) 55 18 98 %      NAD Abdomen soft, NT, ND, incisions c/d/i JP drain with minial ss drainage Foley secure with clear urine Extremities wwp  Hospital Course:  The patient underwent Robot-Assisted Laparoscopic Radical Prostatectomy on 07/24/2014.  The patient tolerated the procedure well, was extubated in the OR, and afterwards was taken to the PACU for routine post-surgical care. When stable the patient was transferred to the floor.   The patient did well postoperatively.  The patient's diet was slowly advanced and at the time of discharge was tolerating a regular diet.  The patient was discharged home 1 Day Post-Op, at which point was tolerating a regular solid diet, was able to void spontaneously, have adequate pain control with P.O. pain medication, and could ambulate without difficulty. The patient will follow up with [place] for post op check.   Condition at Discharge: Improved Discharge Medications:   .med   Pending Test Results:  Surgical Pathology  Discharge Instructions:  Follow-up as scheduled for TOV and pathology review.  Do not lift anything heavier than a gallon of milk (10 lbs) until instructed it is ok.  You have a Foley catheter in place which drains urine out of your bladder. There are two parts: one part has a number and likely a colored band around it - this port should NOT be manipulated; the other piece connects to the drainage tubing and drainage bag. Before discharge from the hospital, your nurse will instruct you how to care for your foley catheter.   A foley catheter drains your bladder by gravity. The drainage bag should always be below the level of your bladder. If you are wearing a leg bag, it should be below your waist, and at night, the bag should lay on the floor next to you in bed.   Sometimes, a piece of tissue or a blood clot can get caught in the foley catheter and cause it not to drain properly. In this case, you may leak urine around the catheter and you may feel like your bladder is full. In this case, you should disconnect the catheter from the drainage bag and flush the catheter with ~30cc of saline (available at [facility]). If this doesn't help, you should come in to be evaluated.  Other times, even though your foley catheter is draining well, you have the feeling that you have a full bladder, and you may leak around your catheter during painful bladder spasms. If this is the case, a medication called oxybutynin (or Ditropan) may help. It is normal to see  some blood in your urine from time to time when you have a foley catheter. This can be due to irritation from the foley catheter inside the bladder. However, if your urine is the color of tomato juice with quarter-sized clots, this can clog the catheter, and you should call us for instructions. A physician will likely need to evaluate you.  If you are unable to get in touch with anyone and you feel it truly is  an emergency, proceed to the nearest ER or call an ambulance.

## 2014-09-09 ENCOUNTER — Ambulatory Visit: Payer: Federal, State, Local not specified - PPO | Admitting: Radiation Oncology

## 2014-09-09 ENCOUNTER — Ambulatory Visit: Payer: Federal, State, Local not specified - PPO

## 2014-10-14 ENCOUNTER — Encounter: Payer: Self-pay | Admitting: Radiation Oncology

## 2014-10-14 NOTE — Progress Notes (Signed)
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 - FOCAL MARGIN INVOLVEMENT. - MICROSCOPIC FOCUS OF CAPSULAR PENETRATION. - SEMINAL VESICLES FREE OF TUMOR. - SEE ONCOLOGY TABLE BELOW.  Past/Anticipated interventions by urology, if any: Prostate Biopsy  Past/Anticipated interventions by medical oncology, if any: None  Weight changes, if any: None  Bowel/Bladder complaints, if any: IPSS sore is 4  Nausea/Vomiting, if any: No  Pain issues, if any: No  SAFETY ISSUES:  Prior radiation no   Pacemaker/ICD? no  Possible current pregnancy? N/A  Is the patient on methotrexate? None  Current Complaints / other details:   Erectile Dysfunction

## 2014-10-15 ENCOUNTER — Encounter: Payer: Self-pay | Admitting: Radiation Oncology

## 2014-10-15 ENCOUNTER — Ambulatory Visit
Admission: RE | Admit: 2014-10-15 | Discharge: 2014-10-15 | Disposition: A | Discharge status: Home / Self Care | Payer: Federal, State, Local not specified - PPO | Source: Ambulatory Visit | Attending: Radiation Oncology | Admitting: Radiation Oncology

## 2014-10-15 VITALS — BP 121/83 | HR 93 | Temp 98.7°F | Ht 70.5 in | Wt 225.6 lb

## 2014-10-15 DIAGNOSIS — Z801 Family history of malignant neoplasm of trachea, bronchus and lung: Secondary | ICD-10-CM | POA: Diagnosis not present

## 2014-10-15 DIAGNOSIS — Z510 Encounter for antineoplastic radiation therapy: Secondary | ICD-10-CM | POA: Diagnosis not present

## 2014-10-15 DIAGNOSIS — C61 Malignant neoplasm of prostate: Secondary | ICD-10-CM | POA: Diagnosis present

## 2014-10-15 DIAGNOSIS — Z8042 Family history of malignant neoplasm of prostate: Secondary | ICD-10-CM | POA: Insufficient documentation

## 2014-10-15 DIAGNOSIS — Z85820 Personal history of malignant melanoma of skin: Secondary | ICD-10-CM | POA: Insufficient documentation

## 2014-10-15 DIAGNOSIS — Z807 Family history of other malignant neoplasms of lymphoid, hematopoietic and related tissues: Secondary | ICD-10-CM | POA: Insufficient documentation

## 2014-10-15 HISTORY — DX: Malignant neoplasm of prostate: C61

## 2014-10-15 NOTE — Progress Notes (Signed)
[facility] NEW PATIENT EVALUATION  Name: Cristian Lewis MRN: [id number]  Date:   10/15/2014           DOB: 12-14-1955  Status: outpatient   CC: Vena Austria, MD  Raynelle Bring, MD    REFERRING PHYSICIAN: Raynelle Bring, MD   DIAGNOSIS: Pathologic stage T3a NX M0 adenocarcinoma prostate   HISTORY OF PRESENT ILLNESS:  Cristian Lewis is a 59 y.o. male who is seen today through the courtesy of Dr. Alinda Money for discussion of adjuvant versus possible later salvage radiotherapy in the management of his pathologic stage T3a adenocarcinoma prostate.  His PSA was 4.37 in November 2015.  He underwent ultrasound-guided biopsies on 12/25/2013.  He was found to have Gleason 6 (3+3) involving 70% of one core from the right lateral base, 80% of one core from the right base, 5% of one core from the right mid gland and 30% of one core from the left lateral base.  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  On 07/24/2014, he underwent a bilateral nerve sparing robotic assisted laparoscopic radical prostatectomy.  On review of his pathology he was found have Gleason 7 (4+3) involving approximate 10% of the prostate tissue in a 30.5 g prostate.  There was a microscopic focus on the left where tumor extended into the immediate adjacent pericapsular connective tissue and a separate focus where the capsule  was partially transected with marginal involvement, also on the left.  Prostatic tissue adjacent to the right and left seminal vesicles was involved but there was no extension into either adjacent seminal vesicles.  There was extensive perineural involvement by tumor.  His first postoperative PSA was 0.01 from 09/09/2014.  He is doing well from a GU and GI standpoint.  He does not need to wear a pad, and only has minimal leakage with fatigue at the end of the long day.  He admits that he is not been as  religious doing his exercises because of staying busy with work and caring for his wife who is on hemodialysis.  He has not been sexually active, and he is unsure of his erectile function.  PREVIOUS RADIATION THERAPY: No   PAST MEDICAL HISTORY:  has a past medical history of Heart murmur; GERD (gastroesophageal reflux disease); Hepatitis; Cancer; and Prostate cancer.     PAST SURGICAL HISTORY:  Past Surgical History  Procedure Laterality Date  . Left knee surgery     . Melanoma excision      left forearm   . Robot assisted laparoscopic radical prostatectomy N/A 07/24/2014    Procedure: ROBOTIC ASSISTED LAPAROSCOPIC RADICAL PROSTATECTOMY LEVEL 1;  Surgeon: Raynelle Bring, MD;  Location: [facility];  Service: Urology;  Laterality: N/A;     FAMILY HISTORY: family history includes Lung cancer in his mother; Lymphoma in his father; Prostate cancer in his brother; Skin cancer in his father.  His father died at age 69 question of urosepsis.  His mother died from lung cancer at 20.  A brother was diagnosed with prostate cancer at age 65.  He died of a brain hemorrhage related to his underlying malignancy.   SOCIAL HISTORY:  reports that he has never smoked. He has never used smokeless tobacco. He reports that he drinks alcohol. He reports that he does not use illicit drugs.  Married, no children.  He works for the [employer].   ALLERGIES: Sulfonamide derivatives   MEDICATIONS:  No current outpatient prescriptions on file.   No current facility-administered medications for this encounter.     REVIEW OF SYSTEMS:  Pertinent items are noted in HPI.    PHYSICAL EXAM:  height is 5' 10.5" (1.791 m) and weight is 225 lb 9.6 oz (102.331 kg). His temperature is 98.7 F (37.1 C). His blood pressure is 121/83 and his pulse is 93.   Alert and oriented.  Rectal examination not performed.   LABORATORY DATA:  Lab Results  Component Value Date   WBC 5.9 07/23/2014   HGB 13.0 07/25/2014   HCT 38.5*  07/25/2014   MCV 89.2 07/23/2014   PLT 243 07/23/2014   Lab Results  Component Value Date   NA 139 07/23/2014   K 4.3 07/23/2014   CL 102 07/23/2014   CO2 31 07/23/2014   Lab Results  Component Value Date   ALT 17 07/23/2014   AST 19 07/23/2014   ALKPHOS 40 07/23/2014   BILITOT 0.7 07/23/2014   PSA 0.01 from 09/09/2014   IMPRESSION: Pathologic stageT3a NX adenocarcinoma prostate.  I explained to the patient that there are certain pathologic features which would indicate a possible role for postoperative radiation therapy including a positive surgical margin, capsule penetration, or seminal vesicle involvement.  The SWOG intergroup trial showed a prostate disease for survival benefit in patients with pathologic stage T3a disease.  However, there is rationale for following patients until there is a PSA recurrence and then offer "salvage" radiation therapy.  Dr. Edison Pace at [facility] is a proponent of this approach all other obviously no prospective randomized trials.  I told the patient that I would like to speak with Dr. Alinda Money to discuss his comfort level with observation because of what appears to be focal margin involvement/capsule penetration.  We discussed the potential acute and late toxicities of radiation therapy.  We also discussed bladder filling to minimize urinary toxicity.  I am more than happy to proceed with radiation therapy in an adjuvant setting, or simply wait and have Dr. Alinda Money follow his PSAs.   PLAN:  As discussed above.   I spent 60 minutes face to face with the patient and more than 50% of that time was spent in counseling and/or coordination of care.  Addendum: I just spoke with Dr. Alinda Money, and we both feel comfortable following him at this point in time.  He'll meet with Dr. Alinda Money in mid-October for a follow-up visit.

## 2014-10-15 NOTE — Addendum Note (Signed)
Encounter addended by: Benn Moulder, RN on: 10/15/2014  7:04 PM<BR>     Documentation filed: BPA Follow-up Actions, Flowsheet VN, Dx Association, Orders

## 2014-10-17 ENCOUNTER — Encounter: Payer: Self-pay | Admitting: *Deleted

## 2014-10-17 NOTE — Progress Notes (Signed)
[facility] Psychosocial Distress Screening Clinical Social Work  Clinical Social Work was referred by distress screening protocol.  The patient scored a 5 on the Psychosocial Distress Thermometer which indicates moderate distress. Clinical Social Worker phoned pt to assess for distress and other psychosocial needs. Pt reports they are in the process of building new house and this results in stress. Pt caregiver for wife as well. CSW explained role of CSW and how we can provide support. Pt educated on Prostate Support Group and will consider attending.   ONCBCN DISTRESS SCREENING 10/15/2014  Screening Type Initial Screening  Distress experienced in past week (1-10) 5  Practical problem type Housing;Work/school  Family Problem type Other (comment)    Clinical Social Worker follow up needed: No.  If yes, follow up plan: Loren Racer, [place]  [facility] Phone: [phone] Fax: [phone]

## 2015-11-11 DIAGNOSIS — M85612 Other cyst of bone, left shoulder: Secondary | ICD-10-CM | POA: Diagnosis not present

## 2015-11-11 DIAGNOSIS — Z85820 Personal history of malignant melanoma of skin: Secondary | ICD-10-CM | POA: Diagnosis not present

## 2016-04-12 DIAGNOSIS — L720 Epidermal cyst: Secondary | ICD-10-CM | POA: Diagnosis not present

## 2016-04-12 DIAGNOSIS — L821 Other seborrheic keratosis: Secondary | ICD-10-CM | POA: Diagnosis not present

## 2016-04-12 DIAGNOSIS — L814 Other melanin hyperpigmentation: Secondary | ICD-10-CM | POA: Diagnosis not present

## 2016-04-12 DIAGNOSIS — D1801 Hemangioma of skin and subcutaneous tissue: Secondary | ICD-10-CM | POA: Diagnosis not present

## 2016-04-12 DIAGNOSIS — L723 Sebaceous cyst: Secondary | ICD-10-CM | POA: Diagnosis not present

## 2016-04-27 DIAGNOSIS — L905 Scar conditions and fibrosis of skin: Secondary | ICD-10-CM | POA: Diagnosis not present

## 2016-04-27 DIAGNOSIS — D0362 Melanoma in situ of left upper limb, including shoulder: Secondary | ICD-10-CM | POA: Diagnosis not present

## 2016-04-27 DIAGNOSIS — D485 Neoplasm of uncertain behavior of skin: Secondary | ICD-10-CM | POA: Diagnosis not present

## 2016-05-11 DIAGNOSIS — C4362 Malignant melanoma of left upper limb, including shoulder: Secondary | ICD-10-CM | POA: Diagnosis not present

## 2016-05-11 DIAGNOSIS — L905 Scar conditions and fibrosis of skin: Secondary | ICD-10-CM | POA: Diagnosis not present

## 2016-08-30 DIAGNOSIS — C61 Malignant neoplasm of prostate: Secondary | ICD-10-CM | POA: Diagnosis not present

## 2016-11-13 IMAGING — CR DG CHEST 2V
2 series · 2 of 2 positions shown · non-contrast
Comparison: Report of a chest x-ray August 06, 1998. The images
are not available and no more recent studies are available.

CLINICAL DATA: Preoperative exam prior to prostate seed
implantation, no chest symptoms

EXAM:
CHEST  2 VIEW

[w chest pa]
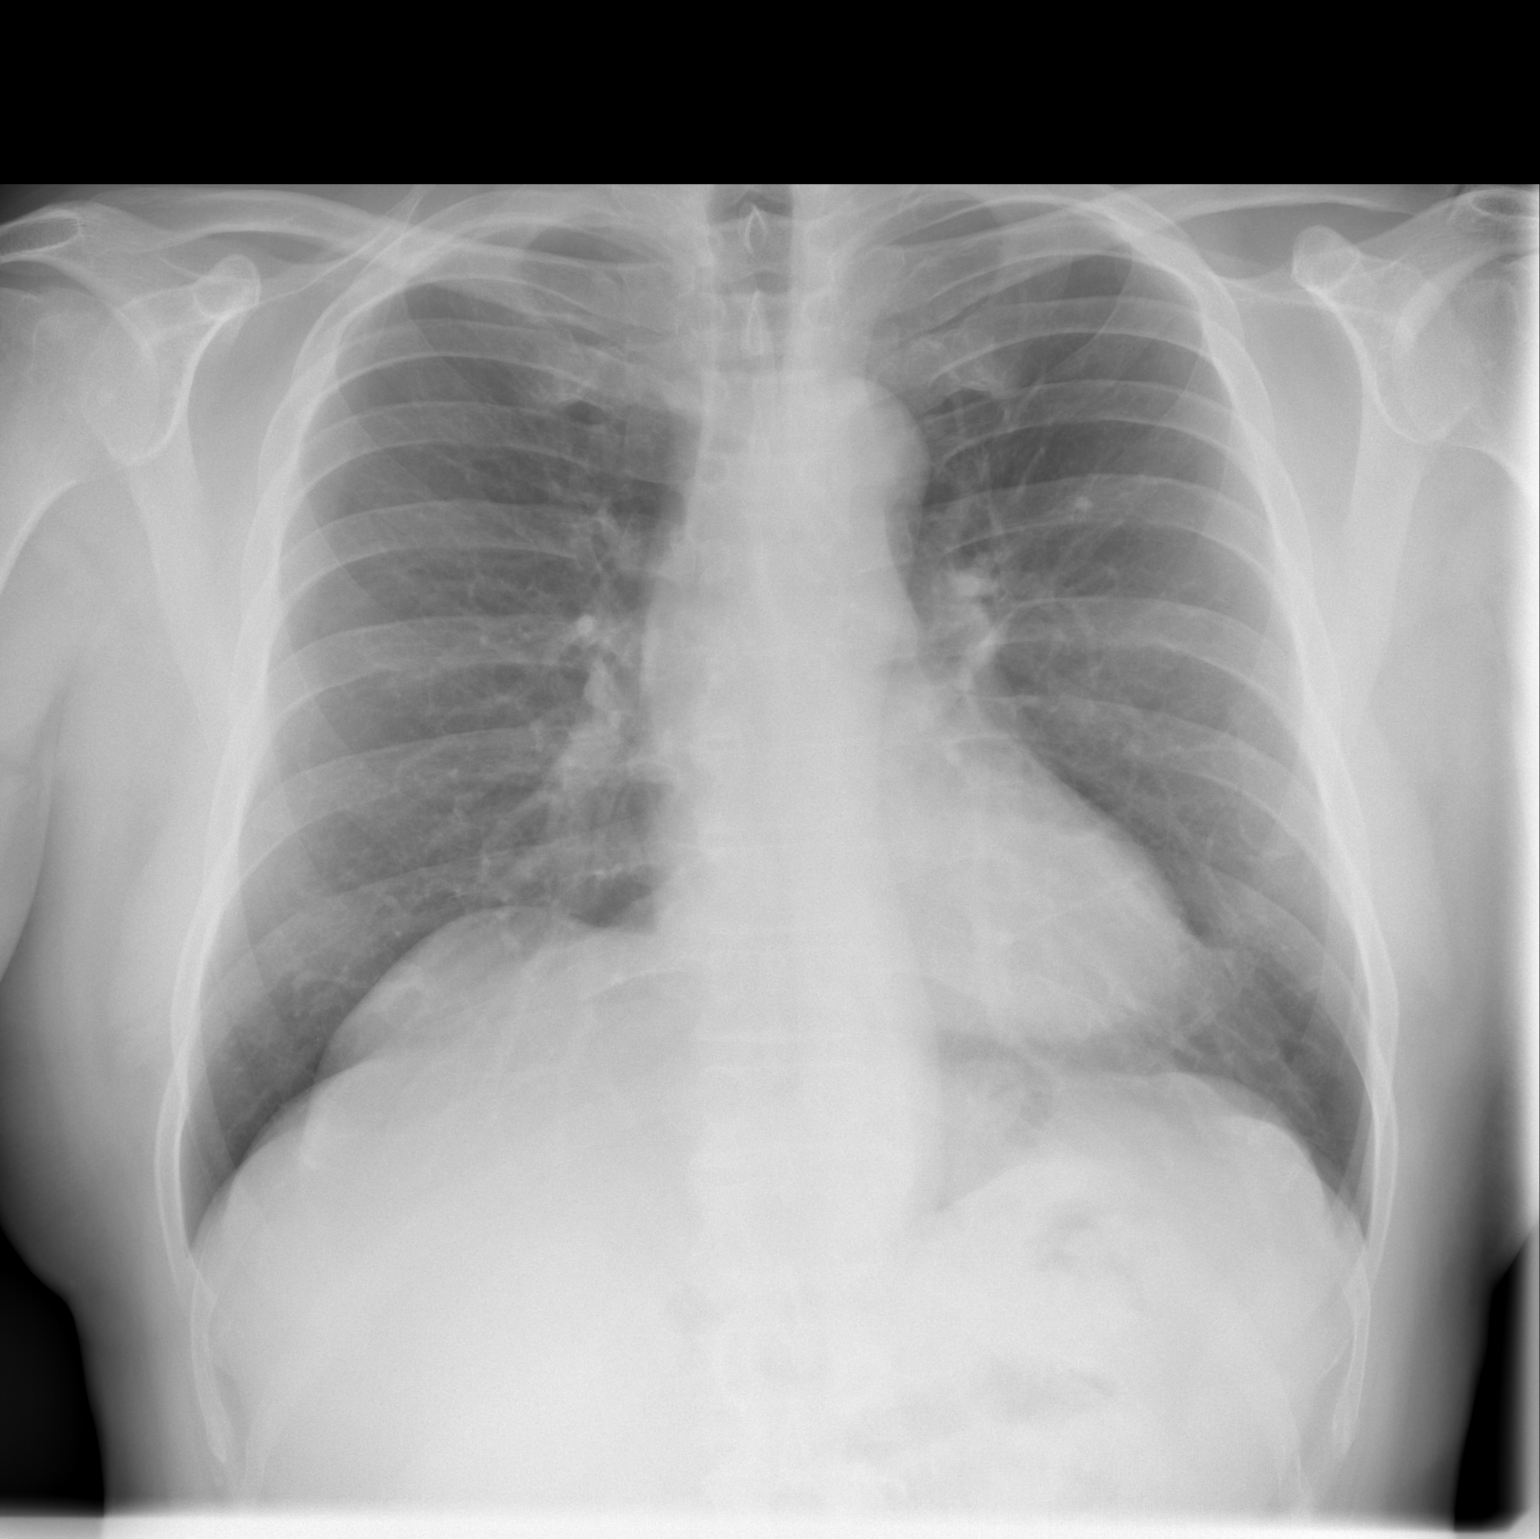

[w chest lat]
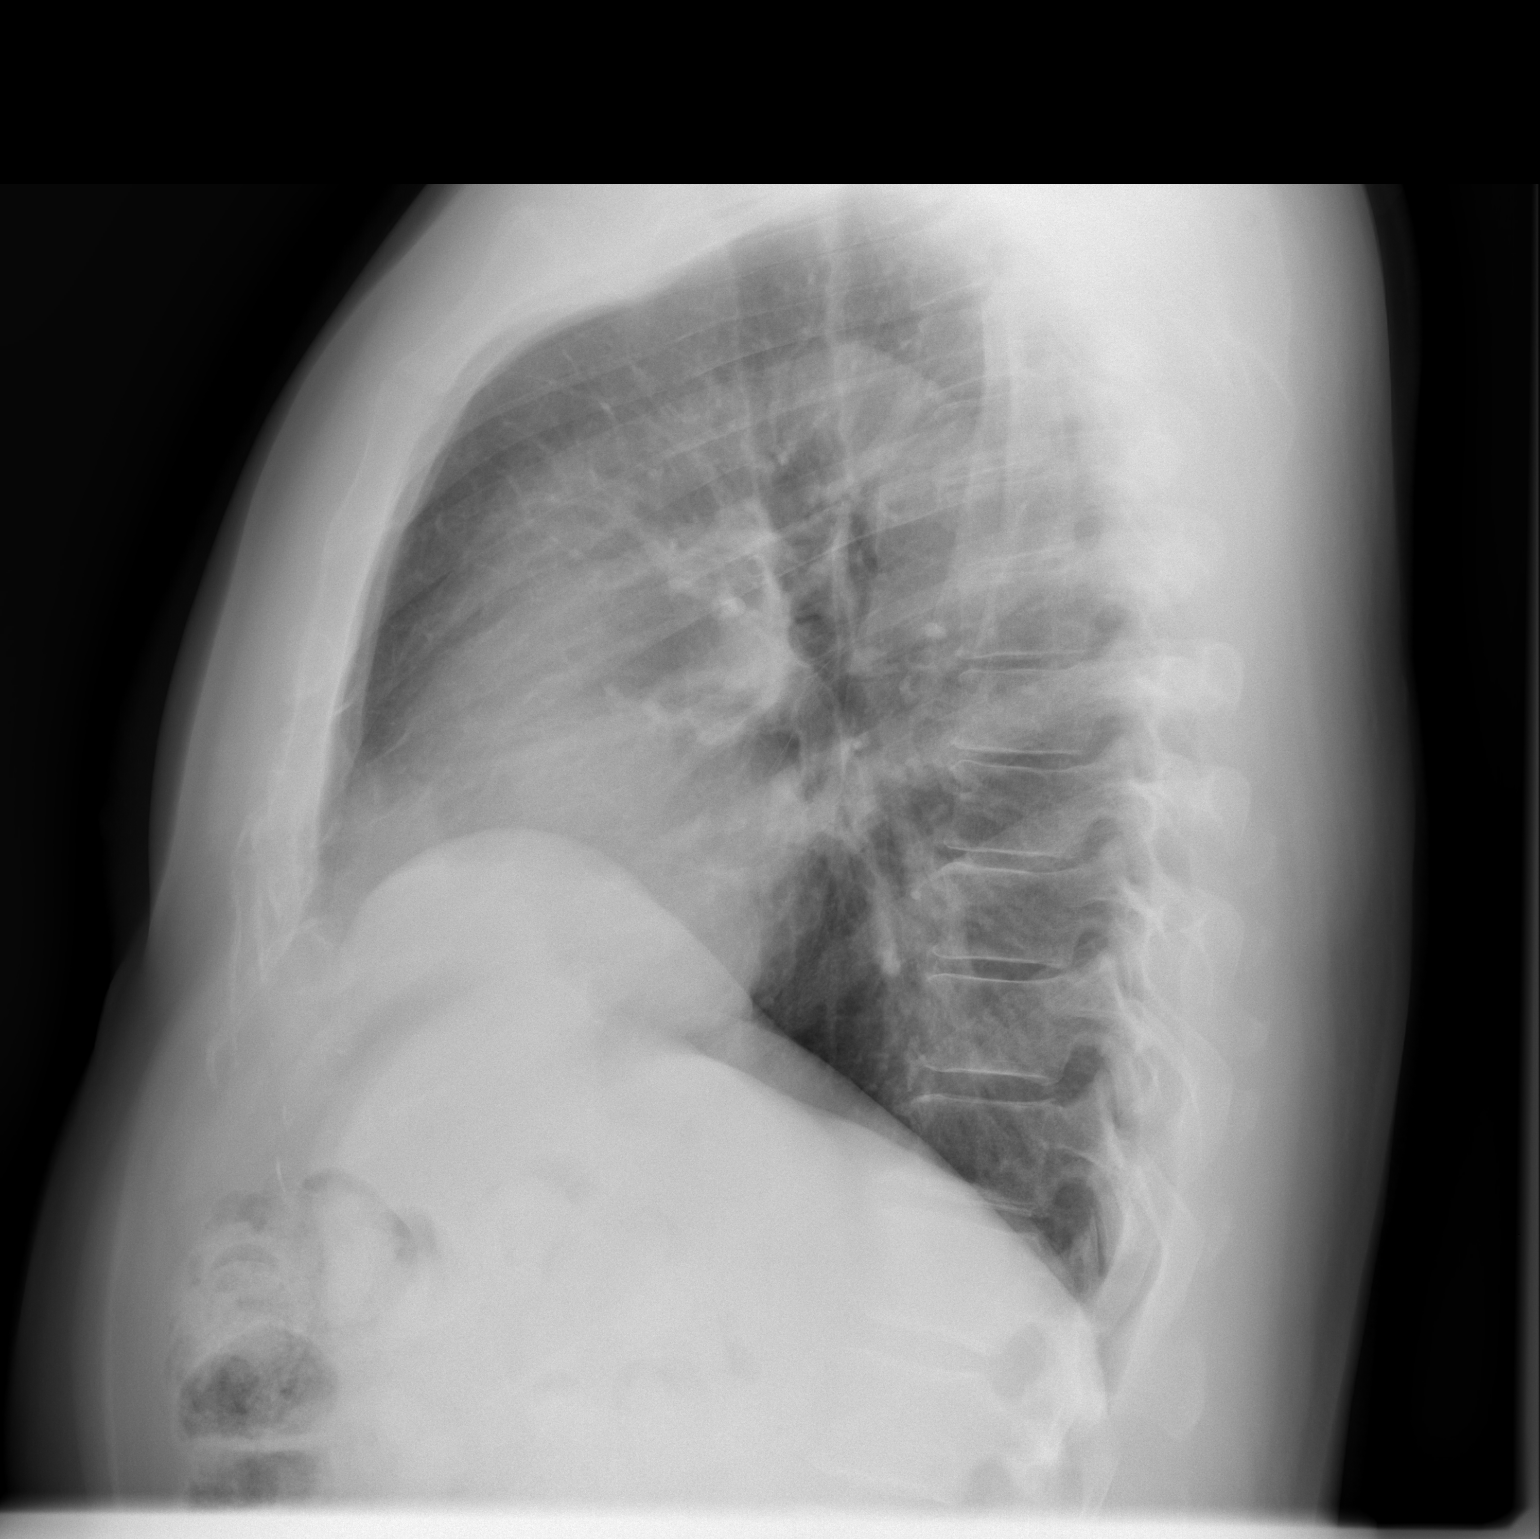

[2 of 2 positions shown; findings below may reference images not displayed]

FINDINGS: The lungs are adequately inflated. There is no focal infiltrate.
There is a 3 mm diameter not a nodule that projects over the
posterior aspect of the left sixth rib. This appears to lie
posteriorly in the inferior aspect of the upper lobe on the lateral
film. This was not mentioned on the report of the previous chest
x-ray. The heart and pulmonary vascularity are normal. There is no
pleural effusion or pneumothorax. The bony thorax exhibits no lytic
or blastic lesions nor other acute abnormalities.
IMPRESSION: 1. There is no pneumonia nor CHF. No bony metastatic disease is
observed.
2. 3 mm diameter soft tissue density nodule projecting posteriorly
and inferiorly in the left upper lobe. While this could reflect a
vessel on end, one cannot exclude a true nodule. Chest CT scanning
is recommended.

## 2016-11-29 DIAGNOSIS — M7661 Achilles tendinitis, right leg: Secondary | ICD-10-CM | POA: Diagnosis not present

## 2016-11-29 DIAGNOSIS — Z23 Encounter for immunization: Secondary | ICD-10-CM | POA: Diagnosis not present

## 2016-12-02 ENCOUNTER — Ambulatory Visit: Payer: Federal, State, Local not specified - PPO | Admitting: Family Medicine

## 2016-12-02 ENCOUNTER — Encounter: Payer: Self-pay | Admitting: Family Medicine

## 2016-12-02 DIAGNOSIS — M7751 Other enthesopathy of right foot: Secondary | ICD-10-CM | POA: Diagnosis not present

## 2016-12-02 DIAGNOSIS — M2141 Flat foot [pes planus] (acquired), right foot: Secondary | ICD-10-CM | POA: Diagnosis not present

## 2016-12-02 DIAGNOSIS — M2142 Flat foot [pes planus] (acquired), left foot: Secondary | ICD-10-CM

## 2016-12-02 DIAGNOSIS — M2140 Flat foot [pes planus] (acquired), unspecified foot: Secondary | ICD-10-CM | POA: Insufficient documentation

## 2016-12-02 MED ORDER — DICLOFENAC SODIUM 1 % TD GEL: 4.0000 g | Freq: Three times a day (TID) | TRANSDERMAL | 1 refills | Status: DC

## 2016-12-02 NOTE — Assessment & Plan Note (Signed)
Patient is presenting with signs and symptoms most consistent with retrocalcaneal bursitis.  Unlikely to be Achilles tendon tendinitis due to the superficial nature of his discomfort and the lack of exacerbation of pain with mechanical stress to the Achilles tendon. -Continue icing and NSAIDs (after completion of prednisone Dosepak prescribed by PCP). -Topical Voltaren gel prescribed. -Proper footwear with arch support discussed >> green shoe inserts with scaphoid pad provided today. -Follow-up 4-6 weeks

## 2016-12-02 NOTE — Progress Notes (Addendum)
   HPI  CC: Right heel pain Patient is presenting today with complaints of acute onset right-sided heel pain.  He states that his pain began last Thursday.  Pain is located along the superior aspect of his right calcaneus.  It is worse with palpation.  He endorses some stiffness and discomfort with ambulation as well.  He denies any trauma, injury, or fall.  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  Shortly after pain onset patient was seen by his PCP who prescribed a prednisone Dosepak and advised to ice this area.  He states that his pain has improved but persists.  Traumatic: No  Location: Posterior superior aspect of the calcaneus, right Quality: Sore, achy, and sharp with palpation  Duration: 8 days Timing: Persistent  Improving/Worsening: Slight improvement Makes better: Rest, and ice Makes worse: Palpation specifically with tight fitting shoes around the posterior Associated symptoms: None  Previous Interventions Tried: Prednisone, ice, rest, NSAIDs PMH: prostate cancer Past Injuries: Noncontributory Past Surgeries: Noncontributory Smoking: Non-smoker Family Hx: Noncontributory  ROS: Per HPI; in addition no fever, no rash, no additional weakness, no additional numbness, no additional paresthesias, and no additional falls/injury.   Objective: BP 119/80   Ht 5\' 10"  (1.778 m)   Wt 213 lb (96.6 kg)   BMI 30.56 kg/m  Gen: NAD, well groomed, a/o x3, normal affect.  CV: Well-perfused. Warm.  Resp: Non-labored.  Neuro: Sensation intact throughout. No gross coordination deficits.  Gait: Nonpathologic posture, unremarkable stride without signs of limp or balance issues. Ankle/Foot, right: TTP noted at the insertion point of the right Achilles tendon, with severe tenderness with light palpation.  Obvious superficial erythema present without evidence of skin wound/lesion.  No visible  swelling, ecchymosis, or bony deformity. No tenderness of the Achilles tendon itself; Notable pes planus deformity with standing which is not obvious without weightbearing (flexible arch). Transverse arch grossly intact; No evidence of tibiotalar deviation; Range of motion is full in all directions. Strength is 5/5 in all directions. No peroneal tendon tenderness or subluxation; No tenderness on posterior aspects of lateral and medial malleolus; Stable lateral and medial ligaments; Talar dome nontender; Unremarkable calcaneal squeeze; No plantar calcaneal tenderness; Able to walk 4 steps.    Assessment and Plan:  Retrocalcaneal bursitis (back of heel), right Patient is presenting with signs and symptoms most consistent with retrocalcaneal bursitis.  Unlikely to be Achilles tendon tendinitis due to the superficial nature of his discomfort and the lack of exacerbation of pain with mechanical stress to the Achilles tendon. -Continue icing and NSAIDs (after completion of prednisone Dosepak prescribed by PCP). -Topical Voltaren gel prescribed. -Proper footwear with arch support discussed >> green shoe inserts with scaphoid pad provided today. -Follow-up 4-6 weeks  Flat foot See plan above   Meds ordered this encounter  Medications  . diclofenac sodium (VOLTAREN) 1 % GEL    Sig: Apply 4 g 3 (three) times daily topically.    Dispense:  100 g    Refill:  [place], MD,MS [facility] Fellow 12/02/2016 12:30 PM

## 2016-12-02 NOTE — Assessment & Plan Note (Signed)
See plan above.

## 2016-12-02 NOTE — Progress Notes (Signed)
[facility]: Attending Note: I have reviewed the chart, discussed wit the Sports Medicine Fellow. I agree with assessment and treatment plan as detailed in the [place] note. His symptoms are more consistent with retrocalcaneal bursitis.  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 He also recalls that on the day prior to this starting, he wore brand-new  shoes for an 8 hour day. He typically wears boots.   I agree with the plan as detailed in the note below. I think he will do better if he continues to wear backless shoes such as Crocs and takes pressure off that area. We will be happy to give him a note for that.

## 2016-12-21 ENCOUNTER — Ambulatory Visit: Payer: Federal, State, Local not specified - PPO | Admitting: Family Medicine

## 2017-03-15 DIAGNOSIS — M79651 Pain in right thigh: Secondary | ICD-10-CM | POA: Diagnosis not present

## 2017-03-18 DIAGNOSIS — J069 Acute upper respiratory infection, unspecified: Secondary | ICD-10-CM | POA: Diagnosis not present

## 2017-03-22 DIAGNOSIS — N5201 Erectile dysfunction due to arterial insufficiency: Secondary | ICD-10-CM | POA: Diagnosis not present

## 2017-03-22 DIAGNOSIS — Z8546 Personal history of malignant neoplasm of prostate: Secondary | ICD-10-CM | POA: Diagnosis not present

## 2017-04-26 DIAGNOSIS — N5231 Erectile dysfunction following radical prostatectomy: Secondary | ICD-10-CM | POA: Diagnosis not present

## 2017-05-17 DIAGNOSIS — D229 Melanocytic nevi, unspecified: Secondary | ICD-10-CM | POA: Diagnosis not present

## 2017-05-17 DIAGNOSIS — D1801 Hemangioma of skin and subcutaneous tissue: Secondary | ICD-10-CM | POA: Diagnosis not present

## 2017-05-17 DIAGNOSIS — L821 Other seborrheic keratosis: Secondary | ICD-10-CM | POA: Diagnosis not present

## 2017-05-17 DIAGNOSIS — L814 Other melanin hyperpigmentation: Secondary | ICD-10-CM | POA: Diagnosis not present

## 2017-10-11 DIAGNOSIS — Z8546 Personal history of malignant neoplasm of prostate: Secondary | ICD-10-CM | POA: Diagnosis not present

## 2017-10-19 DIAGNOSIS — Z8546 Personal history of malignant neoplasm of prostate: Secondary | ICD-10-CM | POA: Diagnosis not present

## 2017-10-19 DIAGNOSIS — N5201 Erectile dysfunction due to arterial insufficiency: Secondary | ICD-10-CM | POA: Diagnosis not present

## 2017-11-16 DIAGNOSIS — D1801 Hemangioma of skin and subcutaneous tissue: Secondary | ICD-10-CM | POA: Diagnosis not present

## 2017-11-16 DIAGNOSIS — D224 Melanocytic nevi of scalp and neck: Secondary | ICD-10-CM | POA: Diagnosis not present

## 2017-11-16 DIAGNOSIS — D485 Neoplasm of uncertain behavior of skin: Secondary | ICD-10-CM | POA: Diagnosis not present

## 2017-11-16 DIAGNOSIS — L814 Other melanin hyperpigmentation: Secondary | ICD-10-CM | POA: Diagnosis not present

## 2017-11-16 DIAGNOSIS — D229 Melanocytic nevi, unspecified: Secondary | ICD-10-CM | POA: Diagnosis not present

## 2017-11-16 DIAGNOSIS — L821 Other seborrheic keratosis: Secondary | ICD-10-CM | POA: Diagnosis not present

## 2018-01-01 DIAGNOSIS — J069 Acute upper respiratory infection, unspecified: Secondary | ICD-10-CM | POA: Diagnosis not present

## 2018-01-07 DIAGNOSIS — L309 Dermatitis, unspecified: Secondary | ICD-10-CM | POA: Diagnosis not present

## 2018-01-07 DIAGNOSIS — M109 Gout, unspecified: Secondary | ICD-10-CM | POA: Diagnosis not present

## 2018-04-11 DIAGNOSIS — M9901 Segmental and somatic dysfunction of cervical region: Secondary | ICD-10-CM | POA: Diagnosis not present

## 2018-04-11 DIAGNOSIS — M9903 Segmental and somatic dysfunction of lumbar region: Secondary | ICD-10-CM | POA: Diagnosis not present

## 2018-04-11 DIAGNOSIS — M62830 Muscle spasm of back: Secondary | ICD-10-CM | POA: Diagnosis not present

## 2018-04-12 DIAGNOSIS — M9901 Segmental and somatic dysfunction of cervical region: Secondary | ICD-10-CM | POA: Diagnosis not present

## 2018-04-12 DIAGNOSIS — M9903 Segmental and somatic dysfunction of lumbar region: Secondary | ICD-10-CM | POA: Diagnosis not present

## 2018-04-12 DIAGNOSIS — M62830 Muscle spasm of back: Secondary | ICD-10-CM | POA: Diagnosis not present

## 2018-04-17 DIAGNOSIS — M62830 Muscle spasm of back: Secondary | ICD-10-CM | POA: Diagnosis not present

## 2018-04-17 DIAGNOSIS — M9903 Segmental and somatic dysfunction of lumbar region: Secondary | ICD-10-CM | POA: Diagnosis not present

## 2018-04-17 DIAGNOSIS — M9901 Segmental and somatic dysfunction of cervical region: Secondary | ICD-10-CM | POA: Diagnosis not present

## 2018-04-18 DIAGNOSIS — Z8546 Personal history of malignant neoplasm of prostate: Secondary | ICD-10-CM | POA: Diagnosis not present

## 2018-04-19 DIAGNOSIS — M9903 Segmental and somatic dysfunction of lumbar region: Secondary | ICD-10-CM | POA: Diagnosis not present

## 2018-04-19 DIAGNOSIS — M9901 Segmental and somatic dysfunction of cervical region: Secondary | ICD-10-CM | POA: Diagnosis not present

## 2018-04-19 DIAGNOSIS — M62830 Muscle spasm of back: Secondary | ICD-10-CM | POA: Diagnosis not present

## 2018-04-24 DIAGNOSIS — M62830 Muscle spasm of back: Secondary | ICD-10-CM | POA: Diagnosis not present

## 2018-04-24 DIAGNOSIS — M9901 Segmental and somatic dysfunction of cervical region: Secondary | ICD-10-CM | POA: Diagnosis not present

## 2018-04-24 DIAGNOSIS — M9903 Segmental and somatic dysfunction of lumbar region: Secondary | ICD-10-CM | POA: Diagnosis not present

## 2018-04-26 DIAGNOSIS — M62830 Muscle spasm of back: Secondary | ICD-10-CM | POA: Diagnosis not present

## 2018-04-26 DIAGNOSIS — M9903 Segmental and somatic dysfunction of lumbar region: Secondary | ICD-10-CM | POA: Diagnosis not present

## 2018-04-26 DIAGNOSIS — M9901 Segmental and somatic dysfunction of cervical region: Secondary | ICD-10-CM | POA: Diagnosis not present

## 2018-04-26 DIAGNOSIS — R05 Cough: Secondary | ICD-10-CM | POA: Diagnosis not present

## 2018-05-01 DIAGNOSIS — M62830 Muscle spasm of back: Secondary | ICD-10-CM | POA: Diagnosis not present

## 2018-05-01 DIAGNOSIS — M9903 Segmental and somatic dysfunction of lumbar region: Secondary | ICD-10-CM | POA: Diagnosis not present

## 2018-05-01 DIAGNOSIS — M9901 Segmental and somatic dysfunction of cervical region: Secondary | ICD-10-CM | POA: Diagnosis not present

## 2018-05-02 DIAGNOSIS — Z8546 Personal history of malignant neoplasm of prostate: Secondary | ICD-10-CM | POA: Diagnosis not present

## 2018-05-02 DIAGNOSIS — N5201 Erectile dysfunction due to arterial insufficiency: Secondary | ICD-10-CM | POA: Diagnosis not present

## 2018-05-03 DIAGNOSIS — M9903 Segmental and somatic dysfunction of lumbar region: Secondary | ICD-10-CM | POA: Diagnosis not present

## 2018-05-03 DIAGNOSIS — M62830 Muscle spasm of back: Secondary | ICD-10-CM | POA: Diagnosis not present

## 2018-05-03 DIAGNOSIS — M9901 Segmental and somatic dysfunction of cervical region: Secondary | ICD-10-CM | POA: Diagnosis not present

## 2018-05-10 DIAGNOSIS — M9903 Segmental and somatic dysfunction of lumbar region: Secondary | ICD-10-CM | POA: Diagnosis not present

## 2018-05-10 DIAGNOSIS — M62830 Muscle spasm of back: Secondary | ICD-10-CM | POA: Diagnosis not present

## 2018-05-10 DIAGNOSIS — M9901 Segmental and somatic dysfunction of cervical region: Secondary | ICD-10-CM | POA: Diagnosis not present

## 2018-05-15 DIAGNOSIS — M62830 Muscle spasm of back: Secondary | ICD-10-CM | POA: Diagnosis not present

## 2018-05-15 DIAGNOSIS — M9901 Segmental and somatic dysfunction of cervical region: Secondary | ICD-10-CM | POA: Diagnosis not present

## 2018-05-15 DIAGNOSIS — M9903 Segmental and somatic dysfunction of lumbar region: Secondary | ICD-10-CM | POA: Diagnosis not present

## 2018-05-17 DIAGNOSIS — M62830 Muscle spasm of back: Secondary | ICD-10-CM | POA: Diagnosis not present

## 2018-05-17 DIAGNOSIS — M9903 Segmental and somatic dysfunction of lumbar region: Secondary | ICD-10-CM | POA: Diagnosis not present

## 2018-05-17 DIAGNOSIS — M9901 Segmental and somatic dysfunction of cervical region: Secondary | ICD-10-CM | POA: Diagnosis not present

## 2018-05-22 DIAGNOSIS — M9901 Segmental and somatic dysfunction of cervical region: Secondary | ICD-10-CM | POA: Diagnosis not present

## 2018-05-22 DIAGNOSIS — M9903 Segmental and somatic dysfunction of lumbar region: Secondary | ICD-10-CM | POA: Diagnosis not present

## 2018-05-22 DIAGNOSIS — M62830 Muscle spasm of back: Secondary | ICD-10-CM | POA: Diagnosis not present

## 2018-06-19 DIAGNOSIS — M62830 Muscle spasm of back: Secondary | ICD-10-CM | POA: Diagnosis not present

## 2018-06-19 DIAGNOSIS — M9901 Segmental and somatic dysfunction of cervical region: Secondary | ICD-10-CM | POA: Diagnosis not present

## 2018-06-19 DIAGNOSIS — M9903 Segmental and somatic dysfunction of lumbar region: Secondary | ICD-10-CM | POA: Diagnosis not present

## 2018-07-03 DIAGNOSIS — M62830 Muscle spasm of back: Secondary | ICD-10-CM | POA: Diagnosis not present

## 2018-07-03 DIAGNOSIS — M9903 Segmental and somatic dysfunction of lumbar region: Secondary | ICD-10-CM | POA: Diagnosis not present

## 2018-07-03 DIAGNOSIS — M9901 Segmental and somatic dysfunction of cervical region: Secondary | ICD-10-CM | POA: Diagnosis not present

## 2018-07-10 DIAGNOSIS — M9903 Segmental and somatic dysfunction of lumbar region: Secondary | ICD-10-CM | POA: Diagnosis not present

## 2018-07-10 DIAGNOSIS — M9901 Segmental and somatic dysfunction of cervical region: Secondary | ICD-10-CM | POA: Diagnosis not present

## 2018-07-10 DIAGNOSIS — M62830 Muscle spasm of back: Secondary | ICD-10-CM | POA: Diagnosis not present

## 2018-09-12 DIAGNOSIS — M9903 Segmental and somatic dysfunction of lumbar region: Secondary | ICD-10-CM | POA: Diagnosis not present

## 2018-09-12 DIAGNOSIS — M62830 Muscle spasm of back: Secondary | ICD-10-CM | POA: Diagnosis not present

## 2018-09-12 DIAGNOSIS — M9901 Segmental and somatic dysfunction of cervical region: Secondary | ICD-10-CM | POA: Diagnosis not present

## 2018-11-06 DIAGNOSIS — K5730 Diverticulosis of large intestine without perforation or abscess without bleeding: Secondary | ICD-10-CM | POA: Diagnosis not present

## 2018-11-06 DIAGNOSIS — K921 Melena: Secondary | ICD-10-CM | POA: Diagnosis not present

## 2018-11-30 DIAGNOSIS — Z1159 Encounter for screening for other viral diseases: Secondary | ICD-10-CM | POA: Diagnosis not present

## 2018-12-04 DIAGNOSIS — Z8546 Personal history of malignant neoplasm of prostate: Secondary | ICD-10-CM | POA: Diagnosis not present

## 2018-12-05 DIAGNOSIS — Z86010 Personal history of colonic polyps: Secondary | ICD-10-CM | POA: Diagnosis not present

## 2018-12-05 DIAGNOSIS — K5730 Diverticulosis of large intestine without perforation or abscess without bleeding: Secondary | ICD-10-CM | POA: Diagnosis not present

## 2018-12-11 DIAGNOSIS — N5231 Erectile dysfunction following radical prostatectomy: Secondary | ICD-10-CM | POA: Diagnosis not present

## 2018-12-11 DIAGNOSIS — Z8546 Personal history of malignant neoplasm of prostate: Secondary | ICD-10-CM | POA: Diagnosis not present

## 2019-01-13 DIAGNOSIS — Z20828 Contact with and (suspected) exposure to other viral communicable diseases: Secondary | ICD-10-CM | POA: Diagnosis not present

## 2019-01-16 DIAGNOSIS — H6980 Other specified disorders of Eustachian tube, unspecified ear: Secondary | ICD-10-CM | POA: Diagnosis not present

## 2019-01-16 DIAGNOSIS — J3089 Other allergic rhinitis: Secondary | ICD-10-CM | POA: Diagnosis not present

## 2019-02-06 DIAGNOSIS — L821 Other seborrheic keratosis: Secondary | ICD-10-CM | POA: Diagnosis not present

## 2019-02-06 DIAGNOSIS — L570 Actinic keratosis: Secondary | ICD-10-CM | POA: Diagnosis not present

## 2019-02-06 DIAGNOSIS — L819 Disorder of pigmentation, unspecified: Secondary | ICD-10-CM | POA: Diagnosis not present

## 2019-02-06 DIAGNOSIS — L738 Other specified follicular disorders: Secondary | ICD-10-CM | POA: Diagnosis not present

## 2019-02-06 DIAGNOSIS — L905 Scar conditions and fibrosis of skin: Secondary | ICD-10-CM | POA: Diagnosis not present

## 2019-02-17 DIAGNOSIS — B029 Zoster without complications: Secondary | ICD-10-CM | POA: Diagnosis not present

## 2019-03-05 DIAGNOSIS — Z13220 Encounter for screening for lipoid disorders: Secondary | ICD-10-CM | POA: Diagnosis not present

## 2019-03-05 DIAGNOSIS — Z0000 Encounter for general adult medical examination without abnormal findings: Secondary | ICD-10-CM | POA: Diagnosis not present

## 2019-03-18 DIAGNOSIS — R05 Cough: Secondary | ICD-10-CM | POA: Diagnosis not present

## 2019-03-21 ENCOUNTER — Other Ambulatory Visit: Payer: Self-pay | Admitting: Physician Assistant

## 2019-03-21 ENCOUNTER — Other Ambulatory Visit: Payer: Self-pay

## 2019-03-21 ENCOUNTER — Ambulatory Visit
Admission: RE | Admit: 2019-03-21 | Discharge: 2019-03-21 | Disposition: A | Discharge status: Home / Self Care | Payer: Federal, State, Local not specified - PPO | Source: Ambulatory Visit | Attending: Physician Assistant | Admitting: Physician Assistant

## 2019-03-21 DIAGNOSIS — R059 Cough, unspecified: Secondary | ICD-10-CM

## 2019-03-21 DIAGNOSIS — R05 Cough: Secondary | ICD-10-CM

## 2019-04-03 DIAGNOSIS — H938X9 Other specified disorders of ear, unspecified ear: Secondary | ICD-10-CM | POA: Diagnosis not present

## 2019-04-03 DIAGNOSIS — H903 Sensorineural hearing loss, bilateral: Secondary | ICD-10-CM | POA: Diagnosis not present

## 2019-04-03 DIAGNOSIS — R519 Headache, unspecified: Secondary | ICD-10-CM | POA: Diagnosis not present

## 2019-04-03 DIAGNOSIS — G4752 REM sleep behavior disorder: Secondary | ICD-10-CM | POA: Diagnosis not present

## 2019-04-03 DIAGNOSIS — G8929 Other chronic pain: Secondary | ICD-10-CM | POA: Diagnosis not present

## 2019-07-26 DIAGNOSIS — K5792 Diverticulitis of intestine, part unspecified, without perforation or abscess without bleeding: Secondary | ICD-10-CM | POA: Diagnosis not present

## 2019-08-27 DIAGNOSIS — E7800 Pure hypercholesterolemia, unspecified: Secondary | ICD-10-CM | POA: Diagnosis not present

## 2019-11-23 DIAGNOSIS — Z20822 Contact with and (suspected) exposure to covid-19: Secondary | ICD-10-CM | POA: Diagnosis not present

## 2019-11-23 DIAGNOSIS — B349 Viral infection, unspecified: Secondary | ICD-10-CM | POA: Diagnosis not present

## 2019-12-03 DIAGNOSIS — M9901 Segmental and somatic dysfunction of cervical region: Secondary | ICD-10-CM | POA: Diagnosis not present

## 2019-12-03 DIAGNOSIS — M5411 Radiculopathy, occipito-atlanto-axial region: Secondary | ICD-10-CM | POA: Diagnosis not present

## 2019-12-05 DIAGNOSIS — Z20822 Contact with and (suspected) exposure to covid-19: Secondary | ICD-10-CM | POA: Diagnosis not present

## 2019-12-05 DIAGNOSIS — M9901 Segmental and somatic dysfunction of cervical region: Secondary | ICD-10-CM | POA: Diagnosis not present

## 2019-12-05 DIAGNOSIS — M5411 Radiculopathy, occipito-atlanto-axial region: Secondary | ICD-10-CM | POA: Diagnosis not present

## 2019-12-09 DIAGNOSIS — M9901 Segmental and somatic dysfunction of cervical region: Secondary | ICD-10-CM | POA: Diagnosis not present

## 2019-12-09 DIAGNOSIS — M5411 Radiculopathy, occipito-atlanto-axial region: Secondary | ICD-10-CM | POA: Diagnosis not present

## 2019-12-11 DIAGNOSIS — M5411 Radiculopathy, occipito-atlanto-axial region: Secondary | ICD-10-CM | POA: Diagnosis not present

## 2019-12-11 DIAGNOSIS — M9901 Segmental and somatic dysfunction of cervical region: Secondary | ICD-10-CM | POA: Diagnosis not present

## 2019-12-12 DIAGNOSIS — M5411 Radiculopathy, occipito-atlanto-axial region: Secondary | ICD-10-CM | POA: Diagnosis not present

## 2019-12-12 DIAGNOSIS — M9901 Segmental and somatic dysfunction of cervical region: Secondary | ICD-10-CM | POA: Diagnosis not present

## 2019-12-16 DIAGNOSIS — M9901 Segmental and somatic dysfunction of cervical region: Secondary | ICD-10-CM | POA: Diagnosis not present

## 2019-12-16 DIAGNOSIS — M5411 Radiculopathy, occipito-atlanto-axial region: Secondary | ICD-10-CM | POA: Diagnosis not present

## 2019-12-24 DIAGNOSIS — M5411 Radiculopathy, occipito-atlanto-axial region: Secondary | ICD-10-CM | POA: Diagnosis not present

## 2019-12-24 DIAGNOSIS — M9901 Segmental and somatic dysfunction of cervical region: Secondary | ICD-10-CM | POA: Diagnosis not present

## 2020-01-04 DIAGNOSIS — G44219 Episodic tension-type headache, not intractable: Secondary | ICD-10-CM | POA: Diagnosis not present

## 2020-01-07 DIAGNOSIS — R110 Nausea: Secondary | ICD-10-CM | POA: Diagnosis not present

## 2020-01-07 DIAGNOSIS — H6983 Other specified disorders of Eustachian tube, bilateral: Secondary | ICD-10-CM | POA: Diagnosis not present

## 2020-01-13 DIAGNOSIS — H2513 Age-related nuclear cataract, bilateral: Secondary | ICD-10-CM | POA: Diagnosis not present

## 2020-01-13 DIAGNOSIS — H40053 Ocular hypertension, bilateral: Secondary | ICD-10-CM | POA: Diagnosis not present

## 2020-01-13 DIAGNOSIS — H5203 Hypermetropia, bilateral: Secondary | ICD-10-CM | POA: Diagnosis not present

## 2020-01-16 ENCOUNTER — Encounter: Payer: Self-pay | Admitting: Neurology

## 2020-01-16 ENCOUNTER — Ambulatory Visit: Payer: Federal, State, Local not specified - PPO | Admitting: Neurology

## 2020-01-16 VITALS — BP 125/87 | HR 56 | Ht 71.0 in | Wt 228.0 lb

## 2020-01-16 DIAGNOSIS — H539 Unspecified visual disturbance: Secondary | ICD-10-CM | POA: Diagnosis not present

## 2020-01-16 NOTE — Progress Notes (Signed)
Reason for visit: Head pressure, and fatigue  Referring physician: Dr. Irene Limbo Cristian Lewis is a 64 y.o. male  History of present illness:  Cristian Lewis is a 64 year old right-handed white male who comes into the office today with onset of some sensation of eye fatigue and some blurring of vision that began in November 2021.  The patient had associated pressure sensation that would migrate about the head, he also reported some sensitivity to light.  He has changed jobs and he is spending a lot more time on the computer and looking at the computer for long periods of time seems to make his eyes more fatigued.  He also began noting some increase in floaters.  He went to his optometrist, Dr. Marin Comment for this.  The patient then went to his primary doctor and was told that he had fluid behind the ears, he was placed on Sudafed.  While being on this medication over the last week he has had a dramatic improvement in the head pressure sensation, he is at least 80% better with this.  The patient saw his ophthalmologist as well and was told that he may have mild glaucoma.  He will be following up for this.  He denies any overt headache or vertigo or dizziness.  He denies any numbness or weakness of extremities or any neck discomfort.  He denies problems with balance or difficulty controlling the bowels or the bladder.  He has not had overt double vision.  He comes to this office for further evaluation.  Past Medical History:  Diagnosis Date  . Cancer [facility]    Melanoma   2013  . GERD (gastroesophageal reflux disease)    hx of   . Headache   . Heart murmur    as a child   . Hepatitis    hx of HEp A   . Prostate cancer Cristian Lewis)     Past Surgical History:  Procedure Laterality Date  . left knee surgery     . MELANOMA EXCISION     left forearm   . ROBOT ASSISTED LAPAROSCOPIC RADICAL PROSTATECTOMY N/A 07/24/2014   Procedure: ROBOTIC ASSISTED LAPAROSCOPIC RADICAL PROSTATECTOMY LEVEL 1;  Surgeon: Raynelle Bring, MD;  Location: [facility];  Service: Urology;  Laterality: N/A;    Family History  Problem Relation Age of Onset  . Lung cancer Mother   . Melanoma Father   . Prostate cancer Brother     Social history:  reports that he has never smoked. He has never used smokeless tobacco. He reports current alcohol use. He reports that he does not use drugs.  Medications:  Prior to Admission medications   Medication Sig Start Date End Date Taking? Authorizing Provider  diclofenac sodium (VOLTAREN) 1 % GEL Apply 4 g 3 (three) times daily topically. 12/02/16  Yes Dickie La, MD  Pseudoephedrine HCl (SUDAFED PO) Take 1 tablet by mouth daily. Taken on the recommendation of PCP for fluid in ears.   Yes [provider]      Allergies  Allergen Reactions  . Hydroxyzine Hcl     Other reaction(s): Felt terrible  . Sulfonamide Derivatives Other (See Comments)    Child hood allergy.     ROS:  Out of a complete 14 system review of symptoms, the patient complains only of the following symptoms, and all other reviewed systems are negative.  Head pressure Eye fatigue  Blood pressure 125/87, pulse (!) 56, height 5\' 11"  (1.803 m), weight 228  lb (103.4 kg).  Physical Exam  General: The patient is alert and cooperative at the time of the examination.  Eyes: Pupils are equal, round, and reactive to light. Discs are flat bilaterally.  Neck: The neck is supple, no carotid bruits are noted.  Respiratory: The respiratory examination is clear.  Cardiovascular: The cardiovascular examination reveals a regular rate and rhythm, no obvious murmurs or rubs are noted.  Skin: Extremities are without significant edema.  Neurologic Exam  Mental status: The patient is alert and oriented x 3 at the time of the examination. The patient has apparent normal recent and remote memory, with an apparently normal attention span and concentration ability.  Cranial nerves: Facial symmetry is present. There  is good sensation of the face to pinprick and soft touch bilaterally. The strength of the facial muscles and the muscles to head turning and shoulder shrug are normal bilaterally. Speech is well enunciated, no aphasia or dysarthria is noted. Extraocular movements are full. Visual fields are full. The tongue is midline, and the patient has symmetric elevation of the soft palate. No obvious hearing deficits are noted.  Motor: The motor testing reveals 5 over 5 strength of all 4 extremities. Good symmetric motor tone is noted throughout.  Sensory: Sensory testing is intact to pinprick, soft touch, vibration sensation, and position sense on all 4 extremities. No evidence of extinction is noted.  Coordination: Cerebellar testing reveals good finger-nose-finger and heel-to-shin bilaterally.  Gait and station: Gait is normal. Tandem gait is normal. Romberg is negative. No drift is seen.  Reflexes: Deep tendon reflexes are symmetric and normal bilaterally. Toes are downgoing bilaterally.   Assessment/Plan:  1.  Head pressure, possible eye fatigue  2.  Chronic insomnia  The patient has had a significant improvement in his pressure sensation of the head in the last week.  He also reports chronic issues with insomnia.  We discussed possibly placing him on some trazodone, but he will try melatonin to 5 mg dose first.  If the pressure sensation worsens in the future he will contact our office and we will check a MRI of the brain.  The patient be sent for some blood work today.  He will follow-up if needed.  His clinical examination was normal.  Jill Alexanders MD 01/16/2020 9:18 AM  [facility] [address] [place], [place] [address]  Phone [phone] Fax [phone]

## 2020-01-22 LAB — SEDIMENTATION RATE: Sed Rate: 2 mm/hr (ref 0–30)

## 2020-01-22 LAB — ACETYLCHOLINE RECEPTOR, BINDING: AChR Binding Ab, Serum: 0.04 nmol/L (ref 0.00–0.24)

## 2020-01-23 ENCOUNTER — Telehealth: Payer: Self-pay | Admitting: Emergency Medicine

## 2020-01-23 NOTE — Telephone Encounter (Signed)
-----   Message from York Spaniel, MD sent at 01/22/2020  5:32 PM EST -----  The blood work results are unremarkable. Please call the patient.  ----- Message ----- From: Nell Range Lab Results In Sent: 01/17/2020   7:37 AM EST To: York Spaniel, MD

## 2020-01-23 NOTE — Telephone Encounter (Signed)
Called patient and went over Dr. Clarisa Kindred findings from patient's blood work.  Patient denied any questions and expressed appreciation.

## 2021-02-09 ENCOUNTER — Encounter: Payer: Self-pay | Admitting: Physician Assistant

## 2021-02-09 ENCOUNTER — Other Ambulatory Visit: Payer: Self-pay

## 2021-02-09 ENCOUNTER — Ambulatory Visit
Admission: EM | Admit: 2021-02-09 | Discharge: 2021-02-09 | Disposition: A | Discharge status: Home / Self Care | Payer: Federal, State, Local not specified - PPO | Attending: Physician Assistant | Admitting: Physician Assistant

## 2021-02-09 DIAGNOSIS — J329 Chronic sinusitis, unspecified: Secondary | ICD-10-CM | POA: Diagnosis not present

## 2021-02-09 DIAGNOSIS — J40 Bronchitis, not specified as acute or chronic: Secondary | ICD-10-CM | POA: Diagnosis not present

## 2021-02-09 MED ORDER — PREDNISONE 20 MG PO TABS: 40.0000 mg | ORAL_TABLET | Freq: Every day | ORAL | 0 refills | Status: AC

## 2021-02-09 MED ORDER — AMOXICILLIN-POT CLAVULANATE 875-125 MG PO TABS: 1.0000 | ORAL_TABLET | Freq: Two times a day (BID) | ORAL | 0 refills | Status: DC

## 2021-02-09 NOTE — ED Triage Notes (Signed)
Pt here for cough and nasal congestion x 1 week; pt sts wife had same recently; denies fever

## 2021-02-09 NOTE — ED Provider Notes (Signed)
[facility]    CSN: [id number] Arrival date & time: 02/09/21  1129      History   Chief Complaint Chief Complaint  Patient presents with   Cough    HPI Cristian Lewis is a 66 y.o. male.   Patient here today for evaluation of cough and congestion that he has had the last week.  He states that he has had significant sinus pressure.  He denies any fever.  He has not had any nausea, vomiting or diarrhea.  He has tried over-the-counter medication without significant relief.  The history is provided by the patient.   Past Medical History:  Diagnosis Date   Cancer [facility])    Melanoma   2013   GERD (gastroesophageal reflux disease)    hx of    Headache    Heart murmur    as a child    Hepatitis    hx of HEp A    Prostate cancer [facility])     Patient Active Problem List   Diagnosis Date Noted   Retrocalcaneal bursitis (back of heel), right 12/02/2016   Flat foot 12/02/2016   Prostate cancer ([place]) 07/24/2014    Past Surgical History:  Procedure Laterality Date   left knee surgery      MELANOMA EXCISION     left forearm    ROBOT ASSISTED LAPAROSCOPIC RADICAL PROSTATECTOMY N/A 07/24/2014   Procedure: ROBOTIC ASSISTED LAPAROSCOPIC RADICAL PROSTATECTOMY LEVEL 1;  Surgeon: Raynelle Bring, MD;  Location: [facility];  Service: Urology;  Laterality: N/A;       Home Medications    Prior to Admission medications   Medication Sig Start Date End Date Taking? Authorizing Provider  amoxicillin-clavulanate (AUGMENTIN) 875-125 MG tablet Take 1 tablet by mouth every 12 (twelve) hours. 02/09/21  Yes Francene Finders, PA-C  predniSONE (DELTASONE) 20 MG tablet Take 2 tablets (40 mg total) by mouth daily with breakfast for 5 days. 02/09/21 02/14/21 Yes Francene Finders, PA-C  diclofenac sodium (VOLTAREN) 1 % GEL Apply 4 g 3 (three) times daily topically. 12/02/16   Dickie La, MD  Pseudoephedrine HCl (SUDAFED PO) Take 1 tablet by mouth daily. Taken on the recommendation of PCP for  fluid in ears.    [provider]    Family History Family History  Problem Relation Age of Onset   Lung cancer Mother    Melanoma Father    Prostate cancer Brother     Social History Social History   Tobacco Use   Smoking status: Never   Smokeless tobacco: Never  Substance Use Topics   Alcohol use: Yes    Comment: occasional glass of wine    Drug use: Never     Allergies   Hydroxyzine hcl and Sulfonamide derivatives   Review of Systems Review of Systems  Constitutional:  Negative for chills and fever.  HENT:  Positive for congestion. Negative for ear pain and sore throat.   Eyes:  Negative for discharge and redness.  Respiratory:  Positive for cough. Negative for shortness of breath.   Gastrointestinal:  Negative for abdominal pain, diarrhea, nausea and vomiting.    Physical Exam Triage Vital Signs ED Triage Vitals  Enc Vitals Group     BP      Pulse      Resp      Temp      Temp src      SpO2      Weight      Height  Head Circumference      Peak Flow      Pain Score      Pain Loc      Pain Edu?      Excl. in [place]?    No data found.  Updated Vital Signs BP (!) 153/92 (BP Location: Left Arm)    Pulse 86    Temp 98.3 F (36.8 C) (Oral)    Resp 18    SpO2 96%       Physical Exam Vitals and nursing note reviewed.  Constitutional:      General: He is not in acute distress.    Appearance: He is well-developed. He is not ill-appearing.  HENT:     Head: Normocephalic and atraumatic.     Nose: Congestion present.     Mouth/Throat:     Tonsils: 0 on the right. 0 on the left.  Eyes:     Conjunctiva/sclera: Conjunctivae normal.  Cardiovascular:     Rate and Rhythm: Normal rate and regular rhythm.     Heart sounds: Normal heart sounds. No murmur heard. Pulmonary:     Effort: Pulmonary effort is normal. No respiratory distress.     Breath sounds: Normal breath sounds. No wheezing, rhonchi or rales.  Skin:    General: Skin is warm and  dry.  Neurological:     Mental Status: He is alert.  Psychiatric:        Mood and Affect: Mood normal.        Behavior: Behavior normal.     UC Treatments / Results  Labs (all labs ordered are listed, but only abnormal results are displayed) Labs Reviewed - No data to display  EKG   Radiology No results found.  Procedures Procedures (including critical care time)  Medications Ordered in UC Medications - No data to display  Initial Impression / Assessment and Plan / UC Course  I have reviewed the triage vital signs and the nursing notes.  Pertinent labs & imaging results that were available during my care of the patient were reviewed by me and considered in my medical decision making (see chart for details).    Augmentin and prednisone prescribed to cover sinusitis as well as bronchitis.  Recommended follow-up if symptoms fail to improve with treatment or worsen.  Final Clinical Impressions(s) / UC Diagnoses   Final diagnoses:  Sinobronchitis   Discharge Instructions   None    ED Prescriptions     Medication Sig Dispense Auth. Provider   amoxicillin-clavulanate (AUGMENTIN) 875-125 MG tablet Take 1 tablet by mouth every 12 (twelve) hours. 14 tablet Ewell Poe F, PA-C   predniSONE (DELTASONE) 20 MG tablet Take 2 tablets (40 mg total) by mouth daily with breakfast for 5 days. 10 tablet Francene Finders, PA-C      PDMP not reviewed this encounter.   Francene Finders, PA-C 02/09/21 1210

## 2021-04-12 ENCOUNTER — Ambulatory Visit
Admission: EM | Admit: 2021-04-12 | Discharge: 2021-04-12 | Disposition: A | Discharge status: Home / Self Care | Payer: Federal, State, Local not specified - PPO

## 2021-04-12 ENCOUNTER — Other Ambulatory Visit: Payer: Self-pay

## 2021-04-12 ENCOUNTER — Encounter: Payer: Self-pay | Admitting: Emergency Medicine

## 2021-04-12 DIAGNOSIS — J069 Acute upper respiratory infection, unspecified: Secondary | ICD-10-CM

## 2021-04-12 MED ORDER — BENZONATATE 100 MG PO CAPS: 100.0000 mg | ORAL_CAPSULE | Freq: Three times a day (TID) | ORAL | 0 refills | Status: DC | PRN

## 2021-04-12 MED ORDER — PREDNISONE 20 MG PO TABS: 40.0000 mg | ORAL_TABLET | Freq: Every day | ORAL | 0 refills | Status: AC

## 2021-04-12 NOTE — Discharge Instructions (Signed)
It appears that you have a viral upper respiratory infection that should run its course and self resolve with symptomatic treatment in the next few days.  You have prescribed 2 medications including prednisone to decrease inflammation and help alleviate symptoms. ?

## 2021-04-12 NOTE — ED Provider Notes (Signed)
?[facility] ? ? ? ?CSN: [id number] ?Arrival date & time: 04/12/21  1035 ? ? ?  ? ?History   ?Chief Complaint ?Chief Complaint  ?Patient presents with  ? Cough  ? ? ?HPI ?Cristian Lewis is a 65 y.o. male.  ? ?Patient presents with sinus congestion, sinus pressure, cough that has been present for approximately 3 days.  Patient denies any known fevers or sick contacts.  Patient has taken TheraFlu and nasal spray with minimal improvement in symptoms.  Denies chest pain, shortness of breath, sore throat, ear pain, nausea, vomiting, diarrhea, abdominal pain.  Denies history of asthma or COPD and patient is not a smoker. ? ? ?Cough ? ?Past Medical History:  ?Diagnosis Date  ? Cancer [facility])   ? Melanoma   2013  ? GERD (gastroesophageal reflux disease)   ? hx of   ? Headache   ? Heart murmur   ? as a child   ? Hepatitis   ? hx of HEp A   ? Prostate cancer ([place])   ? ? ?Patient Active Problem List  ? Diagnosis Date Noted  ? Retrocalcaneal bursitis (back of heel), right 12/02/2016  ? Flat foot 12/02/2016  ? Prostate cancer ([place]) 07/24/2014  ? ? ?Past Surgical History:  ?Procedure Laterality Date  ? left knee surgery     ? MELANOMA EXCISION    ? left forearm   ? ROBOT ASSISTED LAPAROSCOPIC RADICAL PROSTATECTOMY N/A 07/24/2014  ? Procedure: ROBOTIC ASSISTED LAPAROSCOPIC RADICAL PROSTATECTOMY LEVEL 1;  Surgeon: Raynelle Bring, MD;  Location: [facility];  Service: Urology;  Laterality: N/A;  ? ? ? ? ? ?Home Medications   ? ?Prior to Admission medications   ?Medication Sig Start Date End Date Taking? Authorizing Provider  ?benzonatate (TESSALON) 100 MG capsule Take 1 capsule (100 mg total) by mouth every 8 (eight) hours as needed for cough. 04/12/21  Yes Teodora Medici, FNP  ?moxifloxacin (VIGAMOX) 0.5 % ophthalmic solution Apply 1 drop to eye 4 (four) times daily. 04/09/21  Yes [provider]  ?predniSONE (DELTASONE) 20 MG tablet Take 2 tablets (40 mg total) by mouth daily for 5 days. 04/12/21 04/17/21 Yes Teodora Medici, FNP  ?amoxicillin-clavulanate (AUGMENTIN) 875-125 MG tablet Take 1 tablet by mouth every 12 (twelve) hours. 02/09/21   Francene Finders, PA-C  ?diclofenac sodium (VOLTAREN) 1 % GEL Apply 4 g 3 (three) times daily topically. 12/02/16   Dickie La, MD  ?Pseudoephedrine HCl (SUDAFED PO) Take 1 tablet by mouth daily. Taken on the recommendation of PCP for fluid in ears.    [provider]  ? ? ?Family History ?Family History  ?Problem Relation Age of Onset  ? Lung cancer Mother   ? Melanoma Father   ? Prostate cancer Brother   ? ? ?Social History ?Social History  ? ?Tobacco Use  ? Smoking status: Never  ? Smokeless tobacco: Never  ?Substance Use Topics  ? Alcohol use: Yes  ?  Comment: occasional glass of wine   ? Drug use: Never  ? ? ? ?Allergies   ?Hydroxyzine hcl and Sulfonamide derivatives ? ? ?Review of Systems ?Review of Systems ?Per HPI ? ?Physical Exam ?Triage Vital Signs ?ED Triage Vitals  ?Enc Vitals Group  ?   BP 04/12/21 1113 (!) 158/92  ?   Pulse Rate 04/12/21 1113 67  ?   Resp 04/12/21 1113 20  ?   Temp 04/12/21 1113 97.9 ?F (36.6 ?C)  ?   Temp  Source 04/12/21 1113 Oral  ?   SpO2 04/12/21 1113 95 %  ?   Weight 04/12/21 1116 225 lb (102.1 kg)  ?   Height 04/12/21 1116 '5\' 11"'$  (1.803 m)  ?   Head Circumference --   ?   Peak Flow --   ?   Pain Score 04/12/21 1116 0  ?   Pain Loc --   ?   Pain Edu? --   ?   Excl. in [place]? --   ? ?No data found. ? ?Updated Vital Signs ?BP (!) 158/92 (BP Location: Left Arm)   Pulse 67   Temp 97.9 ?F (36.6 ?C) (Oral)   Resp 20   Ht '5\' 11"'$  (1.803 m)   Wt 225 lb (102.1 kg)   SpO2 95%   BMI 31.38 kg/m?  ? ?Visual Acuity ?Right Eye Distance:   ?Left Eye Distance:   ?Bilateral Distance:   ? ?Right Eye Near:   ?Left Eye Near:    ?Bilateral Near:    ? ?Physical Exam ?Constitutional:   ?   General: He is not in acute distress. ?   Appearance: Normal appearance. He is not toxic-appearing or diaphoretic.  ?HENT:  ?   Head: Normocephalic and atraumatic.  ?   Right Ear:  Tympanic membrane and ear canal normal.  ?   Left Ear: Tympanic membrane and ear canal normal.  ?   Nose: Congestion present.  ?   Mouth/Throat:  ?   Mouth: Mucous membranes are moist.  ?   Pharynx: No posterior oropharyngeal erythema.  ?Eyes:  ?   Extraocular Movements: Extraocular movements intact.  ?   Conjunctiva/sclera: Conjunctivae normal.  ?   Pupils: Pupils are equal, round, and reactive to light.  ?Cardiovascular:  ?   Rate and Rhythm: Normal rate and regular rhythm.  ?   Pulses: Normal pulses.  ?   Heart sounds: Normal heart sounds.  ?Pulmonary:  ?   Effort: Pulmonary effort is normal. No respiratory distress.  ?   Breath sounds: Normal breath sounds. No stridor. No wheezing, rhonchi or rales.  ?Abdominal:  ?   General: Abdomen is flat. Bowel sounds are normal.  ?   Palpations: Abdomen is soft.  ?Musculoskeletal:     ?   General: Normal range of motion.  ?   Cervical back: Normal range of motion.  ?Skin: ?   General: Skin is warm and dry.  ?Neurological:  ?   General: No focal deficit present.  ?   Mental Status: He is alert and oriented to person, place, and time. Mental status is at baseline.  ?Psychiatric:     ?   Mood and Affect: Mood normal.     ?   Behavior: Behavior normal.  ? ? ? ?UC Treatments / Results  ?Labs ?(all labs ordered are listed, but only abnormal results are displayed) ?Labs Reviewed - No data to display ? ?EKG ? ? ?Radiology ?No results found. ? ?Procedures ?Procedures (including critical care time) ? ?Medications Ordered in UC ?Medications - No data to display ? ?Initial Impression / Assessment and Plan / UC Course  ?I have reviewed the triage vital signs and the nursing notes. ? ?Pertinent labs & imaging results that were available during my care of the patient were reviewed by me and considered in my medical decision making (see chart for details). ? ?  ? ?Patient presents with symptoms likely from a viral upper respiratory infection. Differential includes bacterial pneumonia,  sinusitis, allergic  rhinitis, COVID-19, flu. Do not suspect underlying cardiopulmonary process. Symptoms seem unlikely related to ACS, CHF or COPD exacerbations, pneumonia, pneumothorax. Patient is nontoxic appearing and not in need of emergent medical intervention.  Suggested viral testing for COVID and flu but patient declined. ? ?Recommended symptom control with over the counter medications: Daily oral anti-histamine, Oral decongestant or IN corticosteroid, saline irrigations, cepacol lozenges, Robitussin, Delsym, honey tea.  Patient requesting prednisone to help with sinus pressure.  Short course of prednisone sent for patient.  Benzonatate also prescribed for patient. ? ?Return if symptoms fail to improve in 1-2 weeks or you develop shortness of breath, chest pain, severe headache. Patient states understanding and is agreeable. ? ?Discharged with PCP followup.  ?Final Clinical Impressions(s) / UC Diagnoses  ? ?Final diagnoses:  ?Viral upper respiratory tract infection with cough  ? ? ? ?Discharge Instructions   ? ?  ?It appears that you have a viral upper respiratory infection that should run its course and self resolve with symptomatic treatment in the next few days.  You have prescribed 2 medications including prednisone to decrease inflammation and help alleviate symptoms. ? ? ? ?ED Prescriptions   ? ? Medication Sig Dispense Auth. Provider  ? predniSONE (DELTASONE) 20 MG tablet Take 2 tablets (40 mg total) by mouth daily for 5 days. 10 tablet [place], [place] E, [place]  ? benzonatate (TESSALON) 100 MG capsule Take 1 capsule (100 mg total) by mouth every 8 (eight) hours as needed for cough. 21 capsule Oswaldo Conroy E, [place]  ? ?  ? ?PDMP not reviewed this encounter. ?  ?Teodora Medici, [place] ?04/12/21 1144 ? ?

## 2021-04-12 NOTE — ED Triage Notes (Signed)
Patient c/o head and sinus congestion, nasal drainage, cough x 3 days.  Patient has been taking Thera-Flu and nasal spray. ?

## 2021-04-19 ENCOUNTER — Other Ambulatory Visit: Payer: Self-pay

## 2021-04-19 ENCOUNTER — Ambulatory Visit
Admission: EM | Admit: 2021-04-19 | Discharge: 2021-04-19 | Disposition: A | Discharge status: Home / Self Care | Payer: Federal, State, Local not specified - PPO | Attending: Physician Assistant | Admitting: Physician Assistant

## 2021-04-19 DIAGNOSIS — J019 Acute sinusitis, unspecified: Secondary | ICD-10-CM | POA: Diagnosis not present

## 2021-04-19 MED ORDER — DOXYCYCLINE HYCLATE 100 MG PO CAPS: 100.0000 mg | ORAL_CAPSULE | Freq: Two times a day (BID) | ORAL | 0 refills | Status: DC

## 2021-04-19 NOTE — ED Triage Notes (Signed)
Last seen one week ago w/similar sxs tx w/prednisone. Pt reports that he felt better until two days ago when sxs returned. Today, he c/o productive cough, fatigue and congestion. Has also been taking alka seltzer. ?

## 2021-04-19 NOTE — ED Provider Notes (Signed)
?[facility] ? ? ? ?CSN: [id number] ?Arrival date & time: 04/19/21  7341 ? ? ?  ? ?History   ?Chief Complaint ?Chief Complaint  ?Patient presents with  ? Cough  ? ? ?HPI ?Cristian Lewis is a 66 y.o. male.  ? ?Patient here today for evaluation of cough and congestion that has been ongoing for over a week. He reports he was seen last a week ago and was prescribed prednisone which did help some but then symptoms returned. He reports significant nasal congestion, productive cough and fatigue. He has been taking alka seltzer cold medicine without significant improvement.  ? ?The history is provided by the patient.  ? ?Past Medical History:  ?Diagnosis Date  ? Cancer [facility])   ? Melanoma   2013  ? GERD (gastroesophageal reflux disease)   ? hx of   ? Headache   ? Heart murmur   ? as a child   ? Hepatitis   ? hx of HEp A   ? Prostate cancer ([place])   ? ? ?Patient Active Problem List  ? Diagnosis Date Noted  ? Retrocalcaneal bursitis (back of heel), right 12/02/2016  ? Flat foot 12/02/2016  ? Prostate cancer ([place]) 07/24/2014  ? ? ?Past Surgical History:  ?Procedure Laterality Date  ? left knee surgery     ? MELANOMA EXCISION    ? left forearm   ? ROBOT ASSISTED LAPAROSCOPIC RADICAL PROSTATECTOMY N/A 07/24/2014  ? Procedure: ROBOTIC ASSISTED LAPAROSCOPIC RADICAL PROSTATECTOMY LEVEL 1;  Surgeon: Raynelle Bring, MD;  Location[facility];  Service: Urology;  Laterality: N/A;  ? ? ? ? ? ?Home Medications   ? ?Prior to Admission medications   ?Medication Sig Start Date End Date Taking? Authorizing Provider  ?doxycycline (VIBRAMYCIN) 100 MG capsule Take 1 capsule (100 mg total) by mouth 2 (two) times daily. 04/19/21  Yes Francene Finders, PA-C  ?benzonatate (TESSALON) 100 MG capsule Take 1 capsule (100 mg total) by mouth every 8 (eight) hours as needed for cough. 04/12/21   Teodora Medici, FNP  ?diclofenac sodium (VOLTAREN) 1 % GEL Apply 4 g 3 (three) times daily topically. 12/02/16   Dickie La, MD  ?moxifloxacin (VIGAMOX)  0.5 % ophthalmic solution Apply 1 drop to eye 4 (four) times daily. 04/09/21   [provider]  ?Pseudoephedrine HCl (SUDAFED PO) Take 1 tablet by mouth daily. Taken on the recommendation of PCP for fluid in ears.    [provider]  ? ? ?Family History ?Family History  ?Problem Relation Age of Onset  ? Lung cancer Mother   ? Melanoma Father   ? Prostate cancer Brother   ? ? ?Social History ?Social History  ? ?Tobacco Use  ? Smoking status: Never  ? Smokeless tobacco: Never  ?Substance Use Topics  ? Alcohol use: Yes  ?  Comment: occasional glass of wine   ? Drug use: Never  ? ? ? ?Allergies   ?Hydroxyzine hcl and Sulfonamide derivatives ? ? ?Review of Systems ?Review of Systems  ?Constitutional:  Positive for fatigue. Negative for chills and fever.  ?HENT:  Positive for congestion. Negative for ear pain and sore throat.   ?Eyes:  Negative for discharge and redness.  ?Respiratory:  Positive for cough. Negative for shortness of breath.   ?Gastrointestinal:  Negative for abdominal pain, nausea and vomiting.  ? ? ?Physical Exam ?Triage Vital Signs ?ED Triage Vitals  ?Enc Vitals Group  ?   BP   ?   Pulse   ?  Resp   ?   Temp   ?   Temp src   ?   SpO2   ?   Weight   ?   Height   ?   Head Circumference   ?   Peak Flow   ?   Pain Score   ?   Pain Loc   ?   Pain Edu?   ?   Excl. in [place]?   ? ?No data found. ? ?Updated Vital Signs ?BP (!) 156/107 (BP Location: Left Arm) Comment: Pt requested a recheck  Pulse 62   Temp 97.8 ?F (36.6 ?C) (Oral)   Resp 18   SpO2 94%  ?   ? ?Physical Exam ?Vitals and nursing note reviewed.  ?Constitutional:   ?   General: He is not in acute distress. ?   Appearance: Normal appearance. He is not ill-appearing.  ?HENT:  ?   Head: Normocephalic and atraumatic.  ?   Nose: Congestion present.  ?Eyes:  ?   Conjunctiva/sclera: Conjunctivae normal.  ?Cardiovascular:  ?   Rate and Rhythm: Normal rate and regular rhythm.  ?   Heart sounds: Normal heart sounds. No murmur  heard. ?Pulmonary:  ?   Effort: Pulmonary effort is normal. No respiratory distress.  ?   Breath sounds: Normal breath sounds. No wheezing, rhonchi or rales.  ?Skin: ?   General: Skin is warm and dry.  ?Neurological:  ?   Mental Status: He is alert.  ?Psychiatric:     ?   Mood and Affect: Mood normal.     ?   Thought Content: Thought content normal.  ? ? ? ?UC Treatments / Results  ?Labs ?(all labs ordered are listed, but only abnormal results are displayed) ?Labs Reviewed - No data to display ? ?EKG ? ? ?Radiology ?No results found. ? ?Procedures ?Procedures (including critical care time) ? ?Medications Ordered in UC ?Medications - No data to display ? ?Initial Impression / Assessment and Plan / UC Course  ?I have reviewed the triage vital signs and the nursing notes. ? ?Pertinent labs & imaging results that were available during my care of the patient were reviewed by me and considered in my medical decision making (see chart for details). ? ?  ? ?\Will treat with antibiotic to cover sinusitis as well as possible developing pna. Discussed elevated BP and that this could be related to illness and/or medications and advised he continue to monitor same. Encouraged follow up if symptoms fail to improve or worsen in any way.  ? ?Final Clinical Impressions(s) / UC Diagnoses  ? ?Final diagnoses:  ?Acute sinusitis, recurrence not specified, unspecified location  ? ?Discharge Instructions   ?None ?  ? ?ED Prescriptions   ? ? Medication Sig Dispense Auth. Provider  ? doxycycline (VIBRAMYCIN) 100 MG capsule Take 1 capsule (100 mg total) by mouth 2 (two) times daily. 20 capsule Francene Finders, PA-C  ? ?  ? ?PDMP not reviewed this encounter. ?  ?Francene Finders, PA-C ?04/19/21 [phone] ? ?

## 2021-07-12 IMAGING — CR DG CHEST 2V
2 series · 2 of 2 positions shown · non-contrast
Comparison: Chest x-ray 07/23/2014.

CLINICAL DATA: Cough.

EXAM:
CHEST - 2 VIEW

[w chest pa]
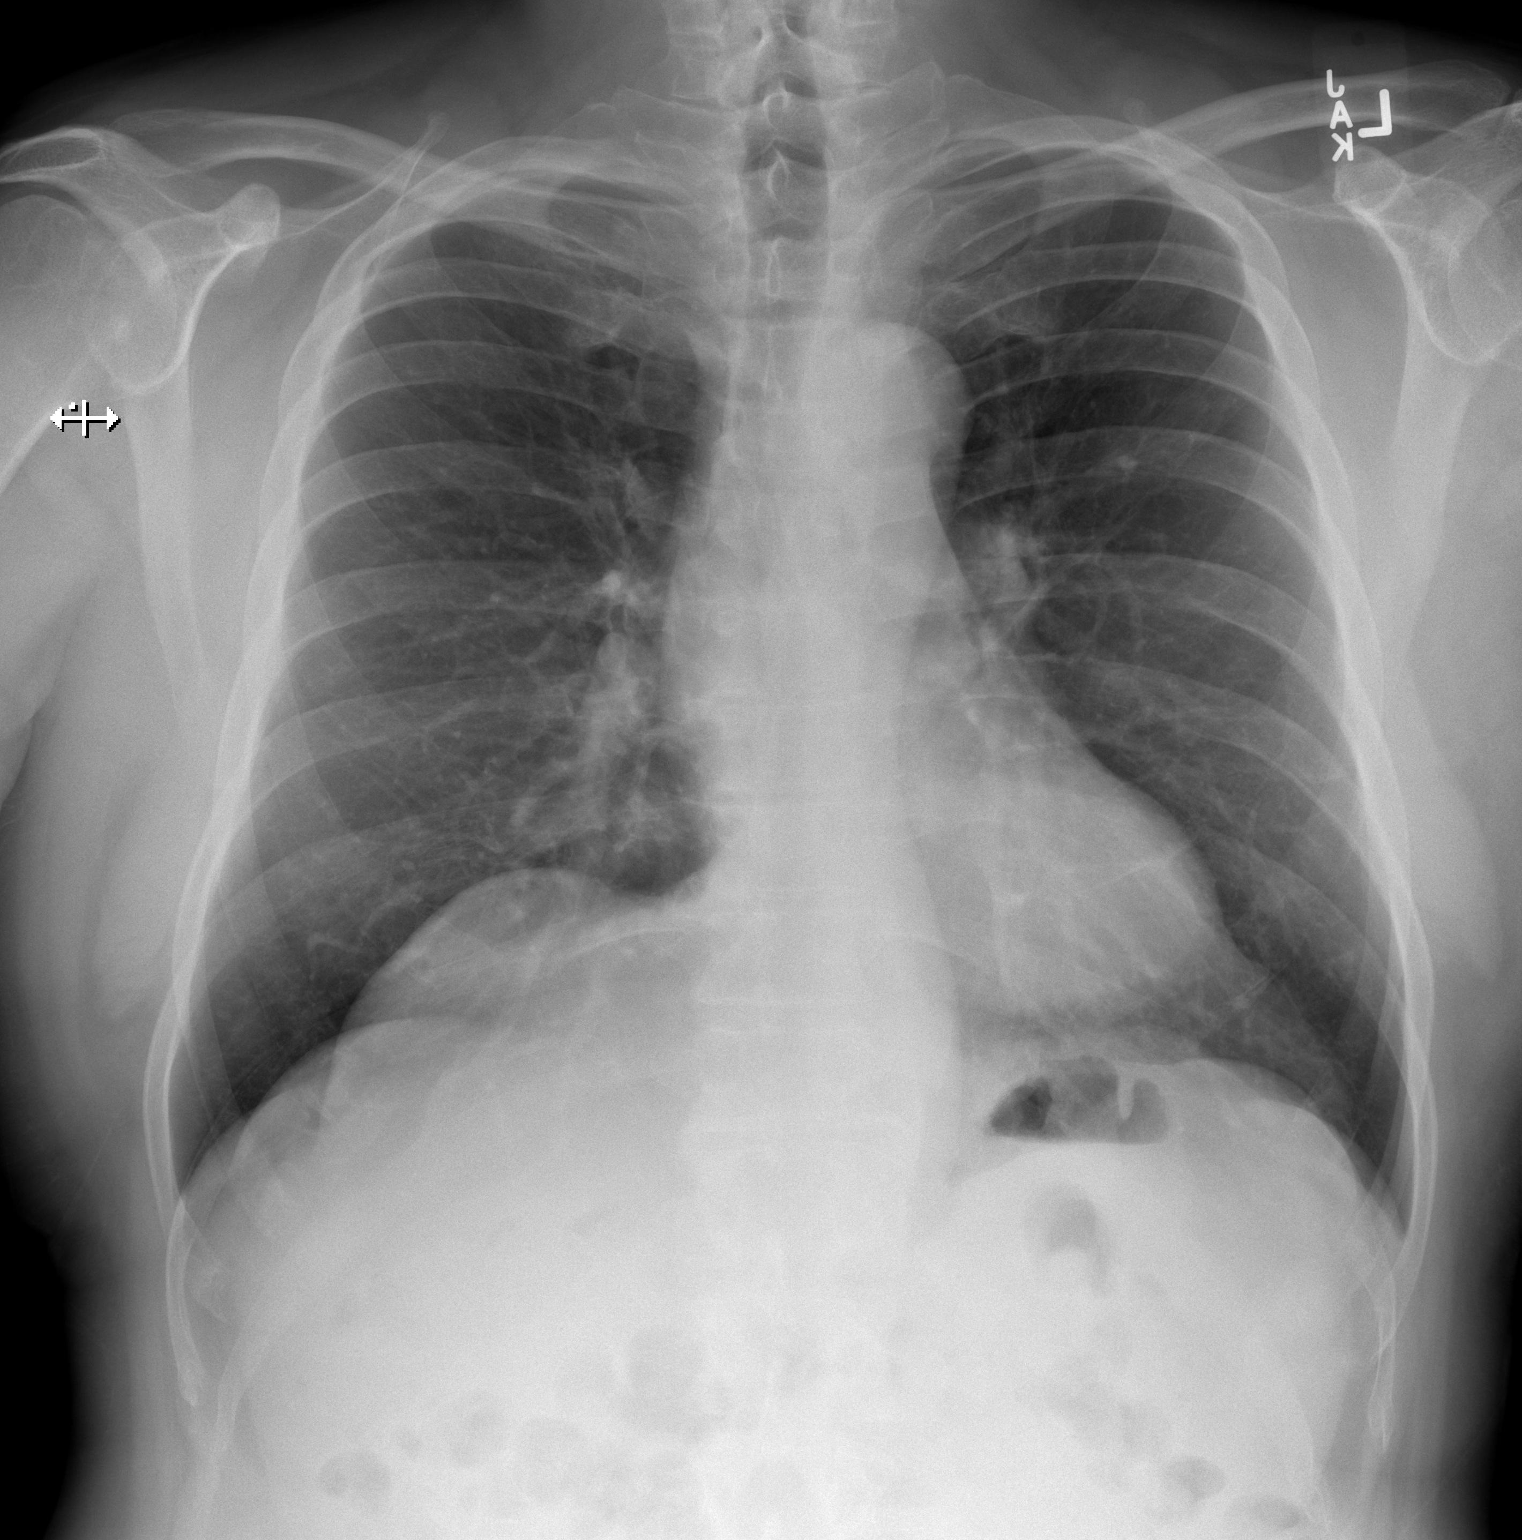

[w chest lat]
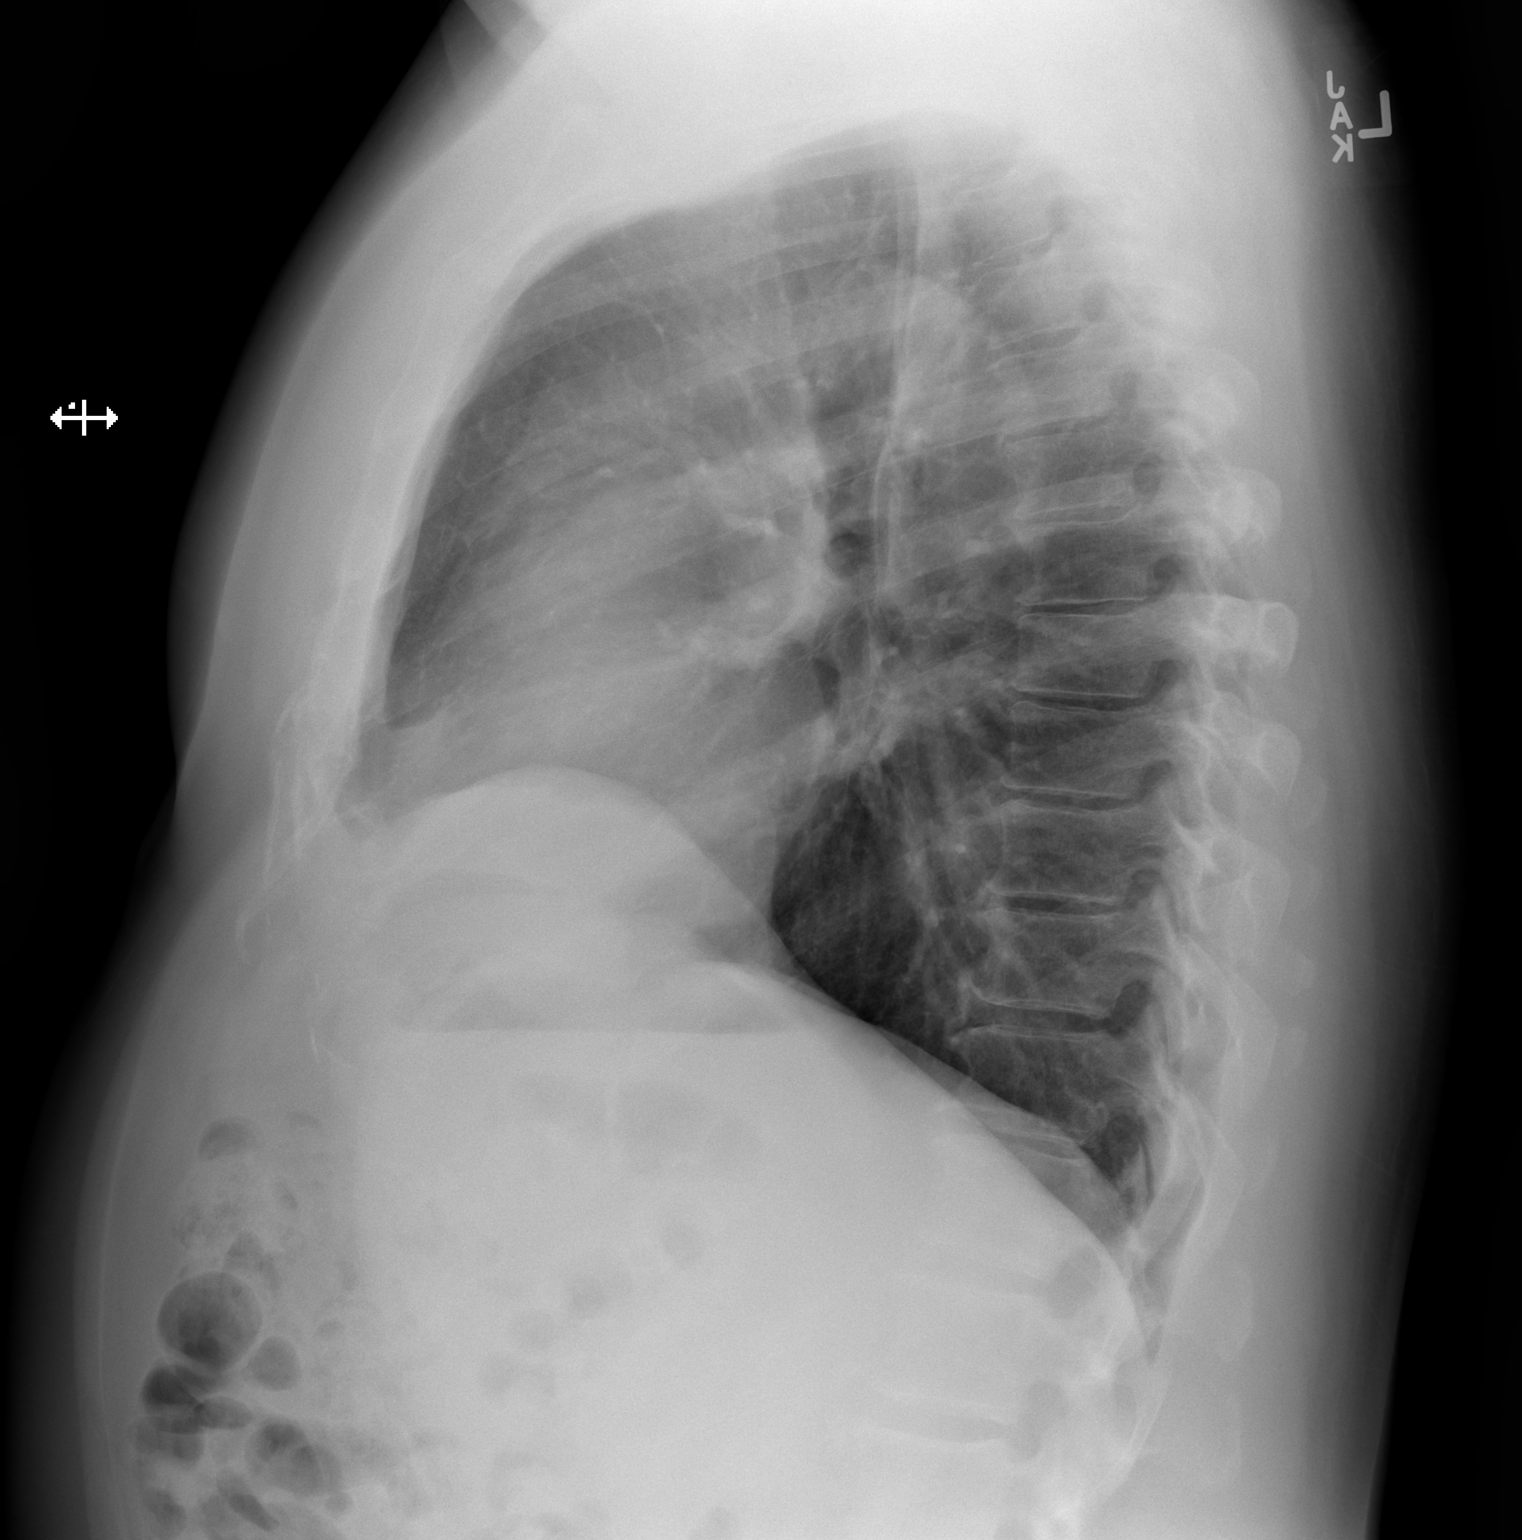

[2 of 2 positions shown; findings below may reference images not displayed]

FINDINGS: Mediastinum and hilar structures normal. Heart size normal. No focal
infiltrate. Stable tiny calcified nodule left upper lung consistent
granuloma. No pleural effusion or pneumothorax.
IMPRESSION: No active cardiopulmonary disease.

## 2021-11-08 ENCOUNTER — Other Ambulatory Visit: Payer: Self-pay | Admitting: Family Medicine

## 2021-11-08 ENCOUNTER — Ambulatory Visit
Admission: RE | Admit: 2021-11-08 | Discharge: 2021-11-08 | Disposition: A | Discharge status: Home / Self Care | Payer: Federal, State, Local not specified - PPO | Source: Ambulatory Visit | Attending: Family Medicine | Admitting: Family Medicine

## 2021-11-08 DIAGNOSIS — J3489 Other specified disorders of nose and nasal sinuses: Secondary | ICD-10-CM

## 2021-11-10 ENCOUNTER — Other Ambulatory Visit: Payer: Self-pay | Admitting: Family Medicine

## 2021-11-10 DIAGNOSIS — R519 Headache, unspecified: Secondary | ICD-10-CM

## 2021-11-10 DIAGNOSIS — H532 Diplopia: Secondary | ICD-10-CM

## 2021-11-29 ENCOUNTER — Ambulatory Visit
Admission: RE | Admit: 2021-11-29 | Discharge: 2021-11-29 | Disposition: A | Discharge status: Home / Self Care | Payer: Federal, State, Local not specified - PPO | Source: Ambulatory Visit | Attending: Family Medicine | Admitting: Family Medicine

## 2021-11-29 DIAGNOSIS — H532 Diplopia: Secondary | ICD-10-CM

## 2021-11-29 DIAGNOSIS — R519 Headache, unspecified: Secondary | ICD-10-CM

## 2021-11-29 MED ORDER — GADOPICLENOL 0.5 MMOL/ML IV SOLN
10.0000 mL | Freq: Once | INTRAVENOUS | Status: AC | PRN
  Administered 2021-11-29: 10 mL via INTRAVENOUS

## 2021-12-06 ENCOUNTER — Encounter: Payer: Self-pay | Admitting: Neurology

## 2021-12-06 NOTE — Progress Notes (Unsigned)
[facility]  PATIENT: Cristian Lewis DOB: [birth year]  REFERRING CLINICIAN: Maury Dus, MD HISTORY FROM: self, wife REASON FOR VISIT: headaches, vision changes   HISTORICAL  CHIEF COMPLAINT:  Chief Complaint  Patient presents with   New Patient (Initial Visit)    Pt reports feeling alright today. 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 He reports feeling pressure around eyes, dull headache with some fatigue. He states 3 weeks ago he was experiencing severe double vision. Room 1 with wife    HISTORY OF PRESENT ILLNESS:  The patient presents for evaluation of headaches and vision changes. States this started with a sinus infection in September 2023. He was treated with antibiotics and steroids, and all of his symptoms resolved except for his headache. Headaches are described as a dull pressure behind the eyes which are associated with photophobia and nausea. No phonophobia. They are sometimes worse with activity. They will typically be mild when he wakes up and worsen as the day goes on. Headaches are daily but fluctuate in severity. He continues to struggle with sinus drainage and feeling like his ears are blocked.  He reports intermittent fuzzy/blurry vision which is worse when his headaches are worse. Denies any true double vision. He also reports a couple of episodes of visual changes described as "small lightning bolts in a circle" in his vision. These only lasts for a few minutes at a time. Saw his ophthalmologist who noted a normal eye exam.  MRI brain 11/29/21 showed a chronic lacunar infarct and mild paranasal sinus disease. It was otherwise unremarkable. He denies any previous episodes of stroke-like symptoms. LDL on 11/09/21 was 187, and he was started on Crestor 5 mg once per week.  He saw Dr. Jannifer Franklin 12/2019 for similar symptoms of blurred vision and pressure-like headaches. ESR and AChR binding antibody were  negative. He was started on melatonin at bedtime.  OTHER MEDICAL CONDITIONS: HLD   REVIEW OF SYSTEMS: Full 14 system review of systems performed and negative with exception of: headaches, blurred vision  ALLERGIES: Allergies  Allergen Reactions   Hydroxyzine Hcl     Other reaction(s): Felt terrible   Pravastatin Sodium Other (See Comments)    Other reaction(s): Myalgias   Sulfonamide Derivatives Other (See Comments)    Child hood allergy.     HOME MEDICATIONS: Outpatient Medications Prior to Visit  Medication Sig Dispense Refill   aspirin 325 MG tablet Take 1 tablet by mouth daily.     cholecalciferol (VITAMIN D3) 25 MCG (1000 UNIT) tablet Take 1,000 Units by mouth daily.     naproxen sodium (ALEVE) 220 MG tablet Take 220 mg by mouth 2 (two) times daily as needed.     rosuvastatin (CRESTOR) 5 MG tablet Take 5 mg by mouth daily.     Vitamin E (VITAMIN E/D-ALPHA NATURAL) 268 MG (400 UNIT) CAPS 1 capsule     diclofenac sodium (VOLTAREN) 1 % GEL Apply 4 g 3 (three) times daily topically. (Patient not taking: Reported on 12/07/2021) 100 g 1   fluticasone (FLONASE) 50 MCG/ACT nasal spray Place 2 sprays into both nostrils daily. (Patient not taking: Reported on 12/07/2021)     moxifloxacin (VIGAMOX) 0.5 % ophthalmic solution Apply 1 drop to eye 4 (four) times daily. (Patient not taking: Reported on 12/07/2021)     No facility-administered medications prior to visit.    PAST MEDICAL HISTORY: Past Medical History:  Diagnosis Date   Cancer ([place])  Melanoma   2013   Colon polyp    Diverticulitis    GERD (gastroesophageal reflux disease)    hx of    Headache    Heart murmur    as a child    Hepatitis    hx of HEp A    Prostate cancer ([place])     PAST SURGICAL HISTORY: Past Surgical History:  Procedure Laterality Date   COLONOSCOPY     2007/2020   left knee surgery      MELANOMA EXCISION     left forearm    ROBOT ASSISTED LAPAROSCOPIC RADICAL PROSTATECTOMY N/A 07/24/2014    Procedure: ROBOTIC ASSISTED LAPAROSCOPIC RADICAL PROSTATECTOMY LEVEL 1;  Surgeon: Raynelle Bring, MD;  Location: [facility];  Service: Urology;  Laterality: N/A;   WISDOM TOOTH EXTRACTION      FAMILY HISTORY: Family History  Problem Relation Age of Onset   Stroke Mother    Lung cancer Mother    Melanoma Father    Lymphoma Father    Prostate cancer Father    Prostate cancer Brother    Brain cancer Brother    Hypercholesterolemia Maternal Grandmother     SOCIAL HISTORY: Social History   Socioeconomic History   Marital status: Married    Spouse name: Not on file   Number of children: 0   Years of education: some college   Highest education level: Not on file  Occupational History   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   Right-handed.   Caffeine use: 1 cup per day.   Social Determinants of Health   Financial Resource Strain: Not on file  Food Insecurity: Not on file  Transportation Needs: Not on file  Physical Activity: Not on file  Stress: Not on file  Social Connections: Not on file  Intimate Partner Violence: Not on file     PHYSICAL EXAM  GENERAL EXAM/CONSTITUTIONAL: Vitals:  Vitals:   12/07/21 0815  BP: 123/84  Pulse: 60  Weight: 239 lb 8 oz (108.6 kg)  Height: _0  (1.778 m)   Body mass index is 34.36 kg/m. Wt Readings from Last 3 Encounters:  12/07/21 239 lb 8 oz (108.6 kg)  04/12/21 225 lb (102.1 kg)  01/16/20 228 lb (103.4 kg)    NEUROLOGIC: MENTAL STATUS:  awake, alert, oriented to person, place and time recent and remote memory intact normal attention and  concentration  CRANIAL NERVE:  2nd, 3rd, 4th, 6th - pupils equal and reactive to light, visual fields full to confrontation, extraocular muscles intact, no nystagmus 5th - facial sensation symmetric 7th - facial strength symmetric 8th - hearing intact 9th - palate elevates symmetrically, uvula midline  MOTOR:  normal bulk and tone, full strength in the BUE, BLE  SENSORY:  normal and symmetric to light touch all 4 extremities  COORDINATION:  finger-nose-finger intact bilaterally  REFLEXES:  deep tendon reflexes present and symmetric  GAIT/STATION:  normal     DIAGNOSTIC DATA (LABS, IMAGING, TESTING) - I reviewed patient records, labs, notes, testing and imaging myself where available.  Lab Results  Component Value Date   WBC 5.9 07/23/2014   HGB 13.0 07/25/2014   HCT 38.5 (L) 07/25/2014   MCV 89.2 07/23/2014   PLT 243 07/23/2014      Component Value Date/Time   NA 139 07/23/2014 0830   K 4.3 07/23/2014 0830   CL 102 07/23/2014 0830   CO2 31 07/23/2014 0830   GLUCOSE 98 07/23/2014 0830   BUN 15 07/23/2014 0830   CREATININE 0.95 07/23/2014 0830   CALCIUM 9.4 07/23/2014 0830   PROT 7.2 07/23/2014 0830   ALBUMIN 4.3 07/23/2014 0830   AST 19 07/23/2014 0830   ALT 17 07/23/2014 0830   ALKPHOS 40 07/23/2014 0830   BILITOT 0.7 07/23/2014 0830   GFRNONAA >60 07/23/2014 0830   GFRAA >60 07/23/2014 0830      ASSESSMENT AND PLAN  66 y.o. year old male with a history of HLD, CVA who presents for evaluation of headaches and vision changes. He denies double vision, instead reports blurred vision and occasional "lightning bolts" in his vision. This sounds more consistent with migraine aura. Suspect he is having migraines as headaches are associated with photophobia and rare nausea. Will start nortriptyline for headache prevention and Nurtec for rescue. MRI brain with a remote CVA. He is already taking ASA. Is only taking Crestor 5 mg per week (had myalgias with previous  statins), will increase to 5 mg daily and titrate as needed for goal LDL of <70. Will complete stroke workup with CTA head/neck and TTE.   1. Cerebrovascular accident (CVA), unspecified mechanism ([place])       PLAN: -Blood work: Hgb A1c -TTE -CTA head/neck -Continue ASA, increase Crestor to 5 mg daily -Prevention: Start nortriptyline 10 mg QHS x1 week, then increase to 20 mg QHS -Rescue: Start Nurtec 75 mg PRN  Orders Placed This Encounter  Procedures   CT ANGIO HEAD W OR WO CONTRAST   CT ANGIO NECK W OR WO CONTRAST   Hemoglobin A1c   ECHOCARDIOGRAM COMPLETE BUBBLE STUDY    Meds ordered this encounter  Medications   Rimegepant Sulfate (NURTEC) 75 MG TBDP    Sig: Take 75 mg by mouth as needed.    Dispense:  8 tablet    Refill:  6   nortriptyline (PAMELOR) 10 MG capsule    Sig: Take 1 pill at bedtime for one week, then increase to 2 pills at bedtime    Dispense:  60 capsule    Refill:  6    Return in about 4 months (around 04/07/2022).    Genia Harold, MD 12/07/21 9:21 AM  I spent an average of 61 minutes chart reviewing and counseling the patient, with at least 50% of the time face to face with the patient.   [facility] [address], [place], [place] [address] [phone]

## 2021-12-07 ENCOUNTER — Telehealth: Payer: Self-pay

## 2021-12-07 ENCOUNTER — Encounter: Payer: Self-pay | Admitting: Psychiatry

## 2021-12-07 ENCOUNTER — Telehealth: Payer: Self-pay | Admitting: Psychiatry

## 2021-12-07 ENCOUNTER — Other Ambulatory Visit: Payer: Self-pay | Admitting: Psychiatry

## 2021-12-07 ENCOUNTER — Ambulatory Visit: Payer: Federal, State, Local not specified - PPO | Admitting: Psychiatry

## 2021-12-07 VITALS — BP 123/84 | HR 60 | Ht 70.0 in | Wt 239.5 lb

## 2021-12-07 DIAGNOSIS — G43109 Migraine with aura, not intractable, without status migrainosus: Secondary | ICD-10-CM | POA: Diagnosis not present

## 2021-12-07 DIAGNOSIS — I639 Cerebral infarction, unspecified: Secondary | ICD-10-CM | POA: Diagnosis not present

## 2021-12-07 MED ORDER — NURTEC 75 MG PO TBDP: 75.0000 mg | ORAL_TABLET | ORAL | 6 refills | Status: DC | PRN

## 2021-12-07 MED ORDER — ROSUVASTATIN CALCIUM 5 MG PO TABS: 5.0000 mg | ORAL_TABLET | Freq: Every day | ORAL | 6 refills | Status: DC

## 2021-12-07 MED ORDER — NORTRIPTYLINE HCL 10 MG PO CAPS: ORAL_CAPSULE | ORAL | 6 refills | Status: DC

## 2021-12-07 NOTE — Patient Instructions (Addendum)
Plan: -CT angiogram of blood vessels in head and neck -echocardiogram (ultrasound of the heart) -Blood work: A1c level -Start nortriptyline for headache prevention. Take 1 pill at bedtime for one week, then increase to 2 pills at bedtime -Start Nurtec as needed for migraines. Take one pill at onset of migraine. Max dose 1 pill in 24 hours  Natural supplements that can help with headaches/head pressure: Magnesium Oxide or Magnesium Glycinate 500 mg at bed (up to 800 mg daily) Coenzyme Q10 300 mg in AM Vitamin B2- 200 mg twice a day  Add 1 supplement at a time since even natural supplements can have undesirable side effects. You can sometimes buy supplements cheaper (especially Coenzyme Q10) at www.https://compton-perez.com/ or at [employer].  Vitamins and herbs that show potential:  Magnesium: Magnesium (250 mg twice a day or 500 mg at bed) has a relaxant effect on smooth muscles such as blood vessels. Individuals suffering from frequent or daily headache usually have low magnesium levels which can be increase with daily supplementation of 400-750 mg. Three trials found 40-90% average headache reduction  when used as a preventative. Magnesium also demonstrated the benefit in menstrually related migraine.  Magnesium is part of the messenger system in the serotonin cascade and it is a good muscle relaxant.  It is also useful for constipation which can be a side effect of other medications used to treat migraine. Good sources include nuts, whole grains, and tomatoes. Side Effects: loose stool/diarrhea Riboflavin (vitamin B 2) 200 mg twice a day. This vitamin assists nerve cells in the production of ATP a principal energy storing molecule.  It is necessary for many chemical reactions in the body.  There have been at least 3 clinical trials of riboflavin using 400 mg per day all of which suggested that migraine frequency can be decreased.  All 3 trials showed significant improvement in over half of migraine sufferers.  The  supplement is found in bread, cereal, milk, meat, and poultry.  Most Americans get more riboflavin than the recommended daily allowance, however riboflavin deficiency is not necessary for the supplements to help prevent headache. Side effects: energizing, green urine  Coenzyme Q10: This is present in almost all cells in the body and is critical component for the conversion of energy.  Recent studies have shown that a nutritional supplement of CoQ10 can reduce the frequency of migraine attacks by improving the energy production of cells as with riboflavin.  Doses of 150 mg twice a day have been shown to be effective.  Melatonin: Increasing evidence shows correlation between melatonin secretion and headache conditions.  Melatonin supplementation has decreased headache intensity and duration.  It is widely used as a sleep aid.  Sleep is natures way of dealing with migraine.  A dose of 3 mg is recommended to start for headaches including cluster headache. Higher doses up to 15 mg has been reviewed for use in Cluster headache and have been used. The rationale behind using melatonin for cluster is that many theories regarding the cause of Cluster headache center around the disruption of the normal circadian rhythm in the brain.  This helps restore the normal circadian rhythm.  HEADACHE DIET: Foods and beverages which may trigger migraine Note that only 20% of headache patients are food sensitive. You will know if you are food sensitive if you get a headache consistently 20 minutes to 2 hours after eating a certain food. Only cut out a food if it causes headaches, otherwise you might remove foods you enjoy!  What matters most for diet is to eat a well balanced healthy diet full of vegetables and low fat protein, and to not miss meals.  Chocolate, other sweets ALL cheeses except cottage and cream cheese Dairy products, yogurt, sour cream, ice cream Liver Meat extracts (Bovril, Marmite, meat tenderizers) Meats  or fish which have undergone aging, fermenting, pickling or smoking. These include: Hotdogs,salami,Lox,sausage, mortadellas,smoked salmon, pepperoni, Pickled herring Pods of broad bean (English beans, Chinese pea pods, [place] (fava) beans, lima and navy beans Ripe avocado, ripe banana Yeast extracts or active yeast preparations such as Brewer's or Fleishman's (commercial bakes goods are permitted) Tomato based foods, pizza (lasagna, etc.)  MSG (monosodium glutamate) is disguised as many things; look for these common aliases: Monopotassium glutamate Autolysed yeast Hydrolysed protein Sodium caseinate "flavorings" "all natural preservatives" Nutrasweet  Avoid all other foods that convincingly provoke headaches.  Resources: The Dizzy Lu Duffel Your Headache Diet, migrainestrong.com  https://www.aguirre.org/  Caffeine and Migraine For patients that have migraine, caffeine intake more than 3 days per week can lead to dependency and increased migraine frequency. I would recommend cutting back on your caffeine intake as best you can. The recommended amount of caffeine is 200-300 mg daily, although migraine patients may experience dependency at even lower doses. While you may notice an increase in headache temporarily, cutting back will be helpful for headaches in the long run. For more information on caffeine and migraine, visit: https://americanmigrainefoundation.org/resource-library/caffeine-and-migraine/  Headache Prevention Strategies:  1. Maintain a headache diary; learn to identify and avoid triggers.  - This can be a simple note where you log when you had a headache, associated symptoms, and medications used - There are several smartphone apps developed to help track migraines: Migraine Buddy, Migraine Monitor, Curelator N1-Headache App  Common triggers include: Emotional triggers: Emotional/Upset family or friends Emotional/Upset  occupation Business reversal/success Anticipation anxiety Crisis-serious Post-crisis periodNew job/position   Physical triggers: Vacation Day Weekend Strenuous Exercise High Altitude Location New Move Menstrual Day Physical Illness Oversleep/Not enough sleep Weather changes Light: Photophobia or light sesnitivity treatment involves a balance between desensitization and reduction in overly strong input. Use dark polarized glasses outside, but not inside. Avoid bright or fluorescent light, but do not dim environment to the point that going into a normally lit room hurts. Consider FL-41 tint lenses, which reduce the most irritating wavelengths without blocking too much light.  These can be obtained at axonoptics.com or theraspecs.com Foods: see list above.  2. Limit use of acute treatments (over-the-counter medications, triptans, etc.) to no more than 2 days per week or 10 days per month to prevent medication overuse headache (rebound headache).    3. Follow a regular schedule (including weekends and holidays): Don't skip meals. Eat a balanced diet. 8 hours of sleep nightly. Minimize stress. Exercise 30 minutes per day. Being overweight is associated with a 5 times increased risk of chronic migraine. Keep well hydrated and drink 6-8 glasses of water per day.  4. Initiate non-pharmacologic measures at the earliest onset of your headache. Rest and quiet environment. Relax and reduce stress. Breathe2Relax is a free app that can instruct you on    some simple relaxtion and breathing techniques. Http://Dawnbuse.com is a    free website that provides teaching videos on relaxation.  Also, there are  many apps that   can be downloaded for "mindful" relaxation.  An app called YOGA NIDRA will help walk you through mindfulness. Another app called Calm can be downloaded to give you a structured mindfulness guide  with daily reminders and skill development. Headspace for guided meditation Mindfulness  Based Stress Reduction Online Course: www.palousemindfulness.com Cold compresses.  5. Don't wait!! Take the maximum allowable dosage of prescribed medication at the first sign of migraine.  6. Compliance:  Take prescribed medication regularly as directed and at the first sign of a migraine.  7. Communicate:  Call your physician when problems arise, especially if your headaches change, increase in frequency/severity, or become associated with neurological symptoms (weakness, numbness, slurred speech, etc.).  8. Headache/pain management therapies: Consider various complementary methods, including medication, behavioral therapy, psychological counselling, biofeedback, massage therapy, acupuncture, dry needling, and other modalities.  Such measures may reduce the need for medications. Counseling for pain management, where patients learn to function and ignore/minimize their pain, seems to work very well.  9. Recommend changing family's attention and focus away from patient's headaches. Instead, emphasize daily activities. If first question of day is 'How are your headaches/Do you have a headache today?', then patient will constantly think about headaches, thus making them worse. Goal is to re-direct attention away from headaches, toward daily activities and other distractions.  10. Helpful Websites: www.AmericanHeadacheSociety.org VoipObserver.it www.headaches.org GolfingFamily.no www.achenet.org

## 2021-12-07 NOTE — Telephone Encounter (Signed)
BCBS federal NPR sent to GI [phone] 

## 2021-12-07 NOTE — Telephone Encounter (Signed)
PA for Nurtec 75 MG Dispersible Tablets has been sent to plan via CMM. Awaiting determination from [place].   KEY: [id number]

## 2021-12-08 LAB — HEMOGLOBIN A1C
Est. average glucose Bld gHb Est-mCnc: 117 mg/dL
Hgb A1c MFr Bld: 5.7 % — ABNORMAL HIGH (ref 4.8–5.6)

## 2021-12-08 NOTE — Telephone Encounter (Addendum)
PA for Nurtec was denied. Patient has to sign a consent form to file an appeal for the medication.  Called patient, he states he will come into office today to sign consent forms for appeal.

## 2021-12-08 NOTE — Telephone Encounter (Addendum)
Patient has signed consent form. Consent form and medical record from last visit have been faxed to [facility] for appeal to be made.

## 2021-12-14 NOTE — Telephone Encounter (Signed)
A Dr Netta Neat from [facility] MD is requesting a peer to peer with Dr Billey Gosling re: the Nurtec for pt.  He is asking for a call on [phone]

## 2021-12-20 ENCOUNTER — Encounter: Payer: Self-pay | Admitting: Neurology

## 2021-12-20 NOTE — Telephone Encounter (Signed)
Received a PA approval for the pt. PA approved for the patient 11/18/2021-12/18/2022 through [facility].

## 2021-12-22 ENCOUNTER — Other Ambulatory Visit: Payer: Self-pay

## 2021-12-22 ENCOUNTER — Other Ambulatory Visit: Payer: Self-pay | Admitting: Neurology

## 2021-12-22 ENCOUNTER — Telehealth: Payer: Self-pay | Admitting: Psychiatry

## 2021-12-22 DIAGNOSIS — I639 Cerebral infarction, unspecified: Secondary | ICD-10-CM

## 2021-12-22 NOTE — Telephone Encounter (Signed)
We are looking into order.

## 2021-12-22 NOTE — Telephone Encounter (Signed)
Contacted pt back, informed him order was place and sent. Do not currently have a update on when they will schedule him.   Had to replace the order due to it being placed to wrong location.

## 2021-12-22 NOTE — Telephone Encounter (Signed)
Pt checking on status of scheduling Echocardiogram complete bubble study. Would like a call from the nurse.

## 2021-12-29 ENCOUNTER — Other Ambulatory Visit: Payer: Self-pay | Admitting: Psychiatry

## 2022-01-03 ENCOUNTER — Encounter (HOSPITAL_COMMUNITY): Payer: Self-pay | Admitting: Radiology

## 2022-01-10 ENCOUNTER — Telehealth: Payer: Self-pay | Admitting: Psychiatry

## 2022-01-10 ENCOUNTER — Ambulatory Visit
Admission: RE | Admit: 2022-01-10 | Discharge: 2022-01-10 | Disposition: A | Discharge status: Home / Self Care | Payer: Federal, State, Local not specified - PPO | Source: Ambulatory Visit | Attending: Psychiatry | Admitting: Psychiatry

## 2022-01-10 ENCOUNTER — Other Ambulatory Visit: Payer: Self-pay | Admitting: Psychiatry

## 2022-01-10 DIAGNOSIS — I639 Cerebral infarction, unspecified: Secondary | ICD-10-CM

## 2022-01-10 DIAGNOSIS — G43109 Migraine with aura, not intractable, without status migrainosus: Secondary | ICD-10-CM

## 2022-01-10 MED ORDER — IOPAMIDOL (ISOVUE-370) INJECTION 76%
100.0000 mL | Freq: Once | INTRAVENOUS | Status: AC | PRN
  Administered 2022-01-10: 100 mL via INTRAVENOUS

## 2022-01-10 NOTE — Telephone Encounter (Signed)
Contacted pt back he stated he has been running out of Nurtec. He can't say if he feels his migraines have worsen because he can go days without one. Currently on 3rd day of migraine, didn't have one for 8 days. He is taking nortriptyline for headache prevention 2 tab at bedtime. He denied taking any OTC or more than 1 tab in 24 hrs of nurtec. What do you recommend for relief ?

## 2022-01-10 NOTE — Telephone Encounter (Signed)
Pt called stating that he has run out of his Rimegepant Sulfate (NURTEC) 75 MG TBDP and they pharmacy will not fill it form him because it has not been 30 days. Pt states that he has had more than 8 headaches so for and has run out of the Nurtec.

## 2022-01-11 MED ORDER — NURTEC 75 MG PO TBDP: 75.0000 mg | ORAL_TABLET | ORAL | 6 refills | Status: DC

## 2022-01-11 NOTE — Telephone Encounter (Signed)
I'll see if we can get him approved for Nurtec every other day for prevention. That way he would be able to get 16 tablets per month. I sent a new rx to his pharmacy

## 2022-01-11 NOTE — Telephone Encounter (Signed)
Contacted pt back, informed him Dr Billey Gosling sent Rx for 16 tables, will submit PA for 16 and notify him of decision once received. He verbally understood and was appreciative.

## 2022-01-11 NOTE — Addendum Note (Signed)
Addended by: Genia Harold on: 01/11/2022 10:59 AM   Modules accepted: Orders

## 2022-01-11 NOTE — Telephone Encounter (Signed)
Pt called back. Requesting call back from nurse.

## 2022-01-11 NOTE — Telephone Encounter (Signed)
Contacted pt back, LVM rq CB  

## 2022-01-11 NOTE — Telephone Encounter (Addendum)
Attempted to submit New PA for 16 tablets submitted via CMM, Key [id number].  Message: This drug is too soon to refill. Please advise the pharmacy to either resubmit on the next fill date, or contact the [place] at [phone] for assistance.  Contacted pharmacy, they stated 16tab went through and they are currently getting it ready.

## 2022-01-26 ENCOUNTER — Ambulatory Visit (HOSPITAL_COMMUNITY): Payer: Federal, State, Local not specified - PPO | Attending: Psychiatry

## 2022-01-26 DIAGNOSIS — I639 Cerebral infarction, unspecified: Secondary | ICD-10-CM | POA: Diagnosis not present

## 2022-01-27 LAB — ECHOCARDIOGRAM COMPLETE BUBBLE STUDY
Area-P 1/2: 3.58 cm2
S' Lateral: 3.1 cm

## 2022-04-06 NOTE — Progress Notes (Signed)
Chief Complaint  Patient presents with   Follow-up    Pt in room 16 here for CVA and migraines. Pt states he still has dullness behind his eyes, has eye floaters, pt states if she drinks coffee it causes headaches.     HISTORY OF PRESENT ILLNESS:  04/11/22 ALL:  Cristian Lewis is a 66 y.o. male here today for follow up for history of CVA and migraines. He was seen in consult with Dr Billey Gosling 11/2021 and started on nortriptyline 20mg  at bedtime and Nurtec as needed. He called 12/2021 stating that he was running out of Nurtec and started on Nurtec QOD for prevention. Since, he reports improvement in headaches. He has continued nortriptyline 20mg  at bedtime. He has not needed to take Nurtec much at all. He did have some pressure behind his eyes, yesterday, and Nurtec worked well to abort. He denies recent aura symptoms. He does note occasional floaters. Eye exam has been normal.   He continues rosuvastatin 5mg  and asa 325mg  daily for stroke prevention. ECHO unremarkable. EF 60-65%. A1C 5.7. He is followed by Dr Jason Fila.    HISTORY (copied from Dr Georgina Peer previous note)  The patient presents for evaluation of headaches and vision changes. States this started with a sinus infection in September 2023. He was treated with antibiotics and steroids, and all of his symptoms resolved except for his headache. Headaches are described as a dull pressure behind the eyes which are associated with photophobia and nausea. No phonophobia. They are sometimes worse with activity. They will typically be mild when he wakes up and worsen as the day goes on. Headaches are daily but fluctuate in severity. He continues to struggle with sinus drainage and feeling like his ears are blocked.   He reports intermittent fuzzy/blurry vision which is worse when his headaches are worse. Denies any true double vision. He also reports a couple of episodes of visual changes described as "small lightning bolts in a circle" in  his vision. These only lasts for a few minutes at a time. Saw his ophthalmologist who noted a normal eye exam.   MRI brain 11/29/21 showed a chronic lacunar infarct and mild paranasal sinus disease. It was otherwise unremarkable. He denies any previous episodes of stroke-like symptoms. LDL on 11/09/21 was 187, and he was started on Crestor 5 mg once per week.   He saw Dr. Jannifer Franklin 12/2019 for similar symptoms of blurred vision and pressure-like headaches. ESR and AChR binding antibody were negative. He was started on melatonin at bedtime.   REVIEW OF SYSTEMS: Out of a complete 14 system review of symptoms, the patient complains only of the following symptoms, headaches, fatigue and all other reviewed systems are negative.   ALLERGIES: Allergies  Allergen Reactions   Hydroxyzine Hcl     Other reaction(s): Felt terrible   Pravastatin Sodium Other (See Comments)    Other reaction(s): Myalgias   Sulfonamide Derivatives Other (See Comments)    Child hood allergy.      HOME MEDICATIONS: Outpatient Medications Prior to Visit  Medication Sig Dispense Refill   aspirin 325 MG tablet Take 1 tablet by mouth daily.     cholecalciferol (VITAMIN D3) 25 MCG (1000 UNIT) tablet Take 1,000 Units by mouth daily.     naproxen sodium (ALEVE) 220 MG tablet Take 220 mg by mouth 2 (two) times daily as needed.     nortriptyline (PAMELOR) 10 MG capsule TAKE 1 PILL AT BEDTIME FOR ONE WEEK, THEN INCREASE TO 2  PILLS AT BEDTIME 180 capsule 3   Rimegepant Sulfate (NURTEC) 75 MG TBDP Take 75 mg by mouth every other day. 16 tablet 6   rosuvastatin (CRESTOR) 5 MG tablet Take 1 tablet (5 mg total) by mouth daily. 30 tablet 6   Vitamin E (VITAMIN E/D-ALPHA NATURAL) 268 MG (400 UNIT) CAPS 1 capsule     No facility-administered medications prior to visit.     PAST MEDICAL HISTORY: Past Medical History:  Diagnosis Date   Cancer [facility])    Melanoma   2013   Colon polyp    Diverticulitis    GERD (gastroesophageal  reflux disease)    hx of    Headache    Heart murmur    as a child    Hepatitis    hx of HEp A    Prostate cancer ([place])      PAST SURGICAL HISTORY: Past Surgical History:  Procedure Laterality Date   COLONOSCOPY     2007/2020   left knee surgery      MELANOMA EXCISION     left forearm    ROBOT ASSISTED LAPAROSCOPIC RADICAL PROSTATECTOMY N/A 07/24/2014   Procedure: ROBOTIC ASSISTED LAPAROSCOPIC RADICAL PROSTATECTOMY LEVEL 1;  Surgeon: Raynelle Bring, MD;  Location: [facility];  Service: Urology;  Laterality: N/A;   WISDOM TOOTH EXTRACTION       FAMILY HISTORY: Family History  Problem Relation Age of Onset   Stroke Mother    Lung cancer Mother    Melanoma Father    Lymphoma Father    Prostate cancer Father    Prostate cancer Brother    Brain cancer Brother    Hypercholesterolemia Maternal Grandmother      SOCIAL HISTORY: Social History   Socioeconomic History   Marital status: Married    Spouse name: Not on file   Number of children: 0   Years of education: some college   Highest education level: Not on file  Occupational History   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   Right-handed.   Caffeine use: 1 cup per day.   Social Determinants of Health   Financial Resource Strain: Not on file  Food Insecurity: Not on file  Transportation Needs: Not on file  Physical Activity: Not on file  Stress: Not on file  Social Connections: Not on file  Intimate Partner Violence: Not on file     PHYSICAL EXAM  Vitals:   04/11/22 0901  BP: 129/85  Pulse: 69  Weight: 242 lb 6.4 oz (110 kg)  Height: 5\' 10"   (1.778 m)   Body mass index is 34.78 kg/m.  Generalized: Well developed, in no acute distress  Cardiology: normal rate and rhythm, no murmur auscultated  Respiratory: clear to auscultation bilaterally    Neurological examination  Mentation: Alert oriented to time, place, history taking. Follows all commands speech and language fluent Cranial nerve II-XII: Pupils were equal round reactive to light. Extraocular movements were full, visual field were full on confrontational test. Facial sensation and strength were normal. Head turning and shoulder  shrug  were normal and symmetric. Motor: The motor testing reveals 5 over 5 strength of all 4 extremities. Good symmetric motor tone is noted throughout.  Gait and station: Gait is normal.    DIAGNOSTIC DATA (LABS, IMAGING, TESTING) - I reviewed patient records, labs, notes, testing and imaging myself where available.  Lab Results  Component Value Date   WBC 5.9 07/23/2014   HGB 13.0 07/25/2014   HCT 38.5 (L) 07/25/2014   MCV 89.2 07/23/2014   PLT 243 07/23/2014      Component Value Date/Time   NA 139 07/23/2014 0830   K 4.3 07/23/2014 0830   CL 102 07/23/2014 0830   CO2 31 07/23/2014 0830   GLUCOSE 98 07/23/2014 0830   BUN 15 07/23/2014 0830   CREATININE 0.95 07/23/2014 0830   CALCIUM 9.4 07/23/2014 0830   PROT 7.2 07/23/2014 0830   ALBUMIN 4.3 07/23/2014 0830   AST 19 07/23/2014 0830   ALT 17 07/23/2014 0830   ALKPHOS 40 07/23/2014 0830   BILITOT 0.7 07/23/2014 0830   GFRNONAA >60 07/23/2014 0830   GFRAA >60 07/23/2014 0830   No results found for: "CHOL", "HDL", "LDLCALC", "LDLDIRECT", "TRIG", "CHOLHDL" Lab Results  Component Value Date   HGBA1C 5.7 (H) 12/07/2021   No results found for: "VITAMINB12" No results found for: "TSH"      No data to display               No data to display           ASSESSMENT AND PLAN  67 y.o. year old male  has a past medical history of Cancer ([place]), Colon polyp,  Diverticulitis, GERD (gastroesophageal reflux disease), Headache, Heart murmur, Hepatitis, and Prostate cancer ([place]). here with    Cerebrovascular accident (CVA), unspecified mechanism ([place])  Migraine with aura and without status migrainosus, not intractable  Cristian Lewis reports that pressure behind eyes and aura symptoms are significantly improved over the past three months. He will continue nortriptyline 20mg  QHS and Nurtec as needed. May consider weaning nortriptyline in future if he continues to do well. Healthy lifestyle habits encouraged. He will follow up with PCP and care team as directed. He will return to see me in 1 year, sooner if needed. He verbalizes understanding and agreement with this plan.   No orders of the defined types were placed in this encounter.    No orders of the defined types were placed in this encounter.    Debbora Presto, MSN, FNP-C 04/11/2022, 1:52 PM  [facility] [address], [place], [place] [address] [phone]

## 2022-04-06 NOTE — Patient Instructions (Addendum)
Below is our plan:  We will continue nortriptyline 20mg  at bedtime and Nurtec as needed.   Please make sure you are staying well hydrated. I recommend 50-60 ounces daily. Well balanced diet and regular exercise encouraged. Consistent sleep schedule with 6-8 hours recommended.   Please continue follow up with care team as directed.   Follow up with me in 6-12 months   You may receive a survey regarding today's visit. I encourage you to leave honest feed back as I do use this information to improve patient care. Thank you for seeing me today!   GENERAL HEADACHE INFORMATION:   Natural supplements: Magnesium Oxide or Magnesium Glycinate 500 mg at bed (up to 800 mg daily) Coenzyme Q10 300 mg in AM Vitamin B2- 200 mg twice a day   Add 1 supplement at a time since even natural supplements can have undesirable side effects. You can sometimes buy supplements cheaper (especially Coenzyme Q10) at www.https://compton-perez.com/ or at [employer].   Vitamins and herbs that show potential:   Magnesium: Magnesium (250 mg twice a day or 500 mg at bed) has a relaxant effect on smooth muscles such as blood vessels. Individuals suffering from frequent or daily headache usually have low magnesium levels which can be increase with daily supplementation of 400-750 mg. Three trials found 40-90% average headache reduction  when used as a preventative. Magnesium also demonstrated the benefit in menstrually related migraine.  Magnesium is part of the messenger system in the serotonin cascade and it is a good muscle relaxant.  It is also useful for constipation which can be a side effect of other medications used to treat migraine. Good sources include nuts, whole grains, and tomatoes. Side Effects: loose stool/diarrhea  Riboflavin (vitamin B 2) 200 mg twice a day. This vitamin assists nerve cells in the production of ATP a principal energy storing molecule.  It is necessary for many chemical reactions in the body.  There have been at  least 3 clinical trials of riboflavin using 400 mg per day all of which suggested that migraine frequency can be decreased.  All 3 trials showed significant improvement in over half of migraine sufferers.  The supplement is found in bread, cereal, milk, meat, and poultry.  Most Americans get more riboflavin than the recommended daily allowance, however riboflavin deficiency is not necessary for the supplements to help prevent headache. Side effects: energizing, green urine   Coenzyme Q10: This is present in almost all cells in the body and is critical component for the conversion of energy.  Recent studies have shown that a nutritional supplement of CoQ10 can reduce the frequency of migraine attacks by improving the energy production of cells as with riboflavin.  Doses of 150 mg twice a day have been shown to be effective.   Melatonin: Increasing evidence shows correlation between melatonin secretion and headache conditions.  Melatonin supplementation has decreased headache intensity and duration.  It is widely used as a sleep aid.  Sleep is natures way of dealing with migraine.  A dose of 3 mg is recommended to start for headaches including cluster headache. Higher doses up to 15 mg has been reviewed for use in Cluster headache and have been used. The rationale behind using melatonin for cluster is that many theories regarding the cause of Cluster headache center around the disruption of the normal circadian rhythm in the brain.  This helps restore the normal circadian rhythm.   HEADACHE DIET: Foods and beverages which may trigger migraine Note that only  20% of headache patients are food sensitive. You will know if you are food sensitive if you get a headache consistently 20 minutes to 2 hours after eating a certain food. Only cut out a food if it causes headaches, otherwise you might remove foods you enjoy! What matters most for diet is to eat a well balanced healthy diet full of vegetables and low fat  protein, and to not miss meals.   Chocolate, other sweets ALL cheeses except cottage and cream cheese Dairy products, yogurt, sour cream, ice cream Liver Meat extracts (Bovril, Marmite, meat tenderizers) Meats or fish which have undergone aging, fermenting, pickling or smoking. These include: Hotdogs,salami,Lox,sausage, mortadellas,smoked salmon, pepperoni, Pickled herring Pods of broad bean (English beans, Chinese pea pods, [place] (fava) beans, lima and navy beans Ripe avocado, ripe banana Yeast extracts or active yeast preparations such as Brewer's or Fleishman's (commercial bakes goods are permitted) Tomato based foods, pizza (lasagna, etc.)   MSG (monosodium glutamate) is disguised as many things; look for these common aliases: Monopotassium glutamate Autolysed yeast Hydrolysed protein Sodium caseinate "flavorings" "all natural preservatives" Nutrasweet   Avoid all other foods that convincingly provoke headaches.   Resources: The Dizzy Lu Duffel Your Headache Diet, migrainestrong.com  https://www.aguirre.org/   Caffeine and Migraine For patients that have migraine, caffeine intake more than 3 days per week can lead to dependency and increased migraine frequency. I would recommend cutting back on your caffeine intake as best you can. The recommended amount of caffeine is 200-300 mg daily, although migraine patients may experience dependency at even lower doses. While you may notice an increase in headache temporarily, cutting back will be helpful for headaches in the long run. For more information on caffeine and migraine, visit: https://americanmigrainefoundation.org/resource-library/caffeine-and-migraine/   Headache Prevention Strategies:   1. Maintain a headache diary; learn to identify and avoid triggers.  - This can be a simple note where you log when you had a headache, associated symptoms, and medications used - There are  several smartphone apps developed to help track migraines: Migraine Buddy, Migraine Monitor, Curelator N1-Headache App   Common triggers include: Emotional triggers: Emotional/Upset family or friends Emotional/Upset occupation Business reversal/success Anticipation anxiety Crisis-serious Post-crisis periodNew job/position   Physical triggers: Vacation Day Weekend Strenuous Exercise High Altitude Location New Move Menstrual Day Physical Illness Oversleep/Not enough sleep Weather changes Light: Photophobia or light sesnitivity treatment involves a balance between desensitization and reduction in overly strong input. Use dark polarized glasses outside, but not inside. Avoid bright or fluorescent light, but do not dim environment to the point that going into a normally lit room hurts. Consider FL-41 tint lenses, which reduce the most irritating wavelengths without blocking too much light.  These can be obtained at axonoptics.com or theraspecs.com Foods: see list above.   2. Limit use of acute treatments (over-the-counter medications, triptans, etc.) to no more than 2 days per week or 10 days per month to prevent medication overuse headache (rebound headache).     3. Follow a regular schedule (including weekends and holidays): Don't skip meals. Eat a balanced diet. 8 hours of sleep nightly. Minimize stress. Exercise 30 minutes per day. Being overweight is associated with a 5 times increased risk of chronic migraine. Keep well hydrated and drink 6-8 glasses of water per day.   4. Initiate non-pharmacologic measures at the earliest onset of your headache. Rest and quiet environment. Relax and reduce stress. Breathe2Relax is a free app that can instruct you on    some simple relaxtion and  breathing techniques. Http://Dawnbuse.com is a    free website that provides teaching videos on relaxation.  Also, there are  many apps that   can be downloaded for "mindful" relaxation.  An app called  YOGA NIDRA will help walk you through mindfulness. Another app called Calm can be downloaded to give you a structured mindfulness guide with daily reminders and skill development. Headspace for guided meditation Mindfulness Based Stress Reduction Online Course: www.palousemindfulness.com Cold compresses.   5. Don't wait!! Take the maximum allowable dosage of prescribed medication at the first sign of migraine.   6. Compliance:  Take prescribed medication regularly as directed and at the first sign of a migraine.   7. Communicate:  Call your physician when problems arise, especially if your headaches change, increase in frequency/severity, or become associated with neurological symptoms (weakness, numbness, slurred speech, etc.).   8. Headache/pain management therapies: Consider various complementary methods, including medication, behavioral therapy, psychological counselling, biofeedback, massage therapy, acupuncture, dry needling, and other modalities.  Such measures may reduce the need for medications. Counseling for pain management, where patients learn to function and ignore/minimize their pain, seems to work very well.   9. Recommend changing family's attention and focus away from patient's headaches. Instead, emphasize daily activities. If first question of day is 'How are your headaches/Do you have a headache today?', then patient will constantly think about headaches, thus making them worse. Goal is to re-direct attention away from headaches, toward daily activities and other distractions.   10. Helpful Websites: www.AmericanHeadacheSociety.org VoipObserver.it www.headaches.org GolfingFamily.no www.achenet.org

## 2022-04-11 ENCOUNTER — Ambulatory Visit: Payer: Federal, State, Local not specified - PPO | Admitting: Family Medicine

## 2022-04-11 ENCOUNTER — Encounter: Payer: Self-pay | Admitting: Family Medicine

## 2022-04-11 VITALS — BP 129/85 | HR 69 | Ht 70.0 in | Wt 242.4 lb

## 2022-04-11 DIAGNOSIS — I639 Cerebral infarction, unspecified: Secondary | ICD-10-CM | POA: Diagnosis not present

## 2022-04-11 DIAGNOSIS — G43109 Migraine with aura, not intractable, without status migrainosus: Secondary | ICD-10-CM

## 2022-04-23 ENCOUNTER — Other Ambulatory Visit: Payer: Self-pay | Admitting: Psychiatry

## 2022-04-25 NOTE — Telephone Encounter (Signed)
Last seen on 04/11/22 Follow up scheduled on 08/29/22 Last filled on 04/03/22 # 90 tablets

## 2022-05-10 ENCOUNTER — Other Ambulatory Visit: Payer: Self-pay | Admitting: Family Medicine

## 2022-05-10 ENCOUNTER — Ambulatory Visit
Admission: RE | Admit: 2022-05-10 | Discharge: 2022-05-10 | Disposition: A | Discharge status: Home / Self Care | Payer: Federal, State, Local not specified - PPO | Source: Ambulatory Visit | Attending: Family Medicine | Admitting: Family Medicine

## 2022-05-10 DIAGNOSIS — R053 Chronic cough: Secondary | ICD-10-CM

## 2022-08-20 ENCOUNTER — Other Ambulatory Visit: Payer: Self-pay | Admitting: Psychiatry

## 2022-08-22 NOTE — Telephone Encounter (Signed)
Last seen on 04/11/22 Follow up schedule done 08/29/22

## 2022-08-24 NOTE — Progress Notes (Signed)
   CC:  headaches, CVA  Follow-up Visit  Last visit: 04/11/22  Brief HPI: 67 year old male with a history of HLD who follows in clinic for migraines and history of CVA. MRI brain 11/29/21 showed a chronic lacunar infarct and mild paranasal sinus disease.   At his last visit he was continued on nortriptyline for migraine prevention and Nurtec for rescue.  Interval History: He decreased nortriptyline 10 mg at bedtime because he has been having to wake up early to exercise. Has not noticed a difference in his headaches since decreasing nortriptyline. Has not needed Nurtec since his first dose. However he continues to have photophobia and floaters which are bothersome to him.  He stopped his Crestor this week as part of a healthy lifestyle program. Continues to take ASA 81.  Current Headache Regimen: Preventative: nortriptyline 10 mg QHS Abortive: Nurtec 75 mg PRN   Prior Therapies                                  Nortriptyline 20 mg at bedtime Nurtec  Physical Exam:   Vital Signs: BP 119/78 (BP Location: Left Arm, Patient Position: Sitting, Cuff Size: Normal)   Pulse 78   Ht 5' 10.5" (1.791 m)   Wt 242 lb (109.8 kg)   BMI 34.23 kg/m  GENERAL:  well appearing, in no acute distress, alert  SKIN:  Color, texture, turgor normal. No rashes or lesions HEAD:  Normocephalic/atraumatic. RESP: normal respiratory effort MSK:  No gross joint deformities.   NEUROLOGICAL: Mental Status: Alert, oriented to person, place and time, Follows commands, and Speech fluent and appropriate. Cranial Nerves: PERRL, face symmetric, no dysarthria, hearing grossly intact Motor: moves all extremities equally Gait: normal-based.  IMPRESSION: 67 year old male with a history of HLD, remote CVA who presents for follow up of headaches. His headaches have been well-controlled but he has persistent photophobia and floaters. Discussed blue light lenses and magnesium supplementation to help with visual symptoms.  He would prefer not to increase dose of nortriptyline. Will prescribe low dose gabapentin for migraine aura.  PLAN: -Continue nortriptyline 10 mg at bedtime for now. Start gabapentin 100 mg at bedtime for migraine aura -Magnesium 500 mg daily for migraine aura -Consider blue light lenses -Continue ASA 81 -Lipid panel today   Follow-up: 2 months  I spent a total of 27 minutes on the date of the service. Headache education was done. Discussed treatment options including preventive and acute medications, and natural supplements. Discussed medication side effects, adverse reactions and drug interactions. Written educational materials and patient instructions outlining all of the above were given.  Ocie Doyne, MD 08/29/22 9:31 AM

## 2022-08-29 ENCOUNTER — Ambulatory Visit: Payer: Federal, State, Local not specified - PPO | Admitting: Psychiatry

## 2022-08-29 ENCOUNTER — Encounter: Payer: Self-pay | Admitting: Psychiatry

## 2022-08-29 VITALS — BP 119/78 | HR 78 | Ht 70.5 in | Wt 242.0 lb

## 2022-08-29 DIAGNOSIS — I639 Cerebral infarction, unspecified: Secondary | ICD-10-CM

## 2022-08-29 DIAGNOSIS — G43109 Migraine with aura, not intractable, without status migrainosus: Secondary | ICD-10-CM | POA: Diagnosis not present

## 2022-08-29 MED ORDER — GABAPENTIN 100 MG PO CAPS: 100.0000 mg | ORAL_CAPSULE | Freq: Every day | ORAL | 5 refills | Status: DC

## 2022-10-19 NOTE — Progress Notes (Unsigned)
   CC:  headaches  Follow-up Visit  Last visit: 08/29/22  Brief HPI: 67 year old male with a history of HLD who follows in clinic for migraines and history of CVA. MRI brain 11/29/21 showed a chronic lacunar infarct and mild paranasal sinus disease.   At his last visit he was continued on nortriptyline 10 mg at bedtime and started on gabapentin 100 mg at bedtime. Interval History: ***   Headache days per month: *** Migraine days per month*** Headache free days per month: ***  Current Headache Regimen: Preventative: *** Abortive: ***   Prior Therapies                                  Nortriptyline 20 mg at bedtime Nurtec  Physical Exam:   Vital Signs: There were no vitals taken for this visit. GENERAL:  well appearing, in no acute distress, alert  SKIN:  Color, texture, turgor normal. No rashes or lesions HEAD:  Normocephalic/atraumatic. RESP: normal respiratory effort MSK:  No gross joint deformities.   NEUROLOGICAL: Mental Status: Alert, oriented to person, place and time, Follows commands, and Speech fluent and appropriate. Cranial Nerves: PERRL, face symmetric, no dysarthria, hearing grossly intact Motor: moves all extremities equally Gait: normal-based.  IMPRESSION: ***  PLAN: ***   Follow-up: ***  I spent a total of *** minutes on the date of the service. Headache education was done. Discussed lifestyle modification including increased oral hydration, decreased caffeine, exercise and stress management. Discussed treatment options including preventive and acute medications, natural supplements, and infusion therapy. Discussed medication overuse headache and to limit use of acute treatments to no more than 2 days/week or 10 days/month. Discussed medication side effects, adverse reactions and drug interactions. Written educational materials and patient instructions outlining all of the above were given.  Ocie Doyne, MD

## 2022-10-20 ENCOUNTER — Ambulatory Visit: Payer: Federal, State, Local not specified - PPO | Admitting: Psychiatry

## 2022-10-20 VITALS — BP 102/61 | HR 62 | Ht 71.0 in | Wt 223.0 lb

## 2022-10-20 DIAGNOSIS — E785 Hyperlipidemia, unspecified: Secondary | ICD-10-CM | POA: Diagnosis not present

## 2022-10-20 DIAGNOSIS — G43109 Migraine with aura, not intractable, without status migrainosus: Secondary | ICD-10-CM | POA: Diagnosis not present

## 2022-10-20 NOTE — Patient Instructions (Addendum)
    Riboflavin (vitamin B 2) 200 mg twice a day. This vitamin assists nerve cells in the production of ATP a principal energy storing molecule.  It is necessary for many chemical reactions in the body.   The supplement is found in bread, cereal, milk, meat, and poultry.    Coenzyme Q10: This is present in almost all cells in the body and is critical component for the conversion of energy.  Recent studies have shown that a nutritional supplement of CoQ10 can improve the energy production of cells as with riboflavin.  Doses of 150 mg twice a day or 300 mg in the morning have been shown to be effective.

## 2022-10-21 LAB — LIPID PANEL
Chol/HDL Ratio: 5 {ratio} (ref 0.0–5.0)
Cholesterol, Total: 186 mg/dL (ref 100–199)
HDL: 37 mg/dL — ABNORMAL LOW (ref >39)
LDL Chol Calc (NIH): 133 mg/dL — ABNORMAL HIGH (ref 0–99)
Triglycerides: 86 mg/dL (ref 0–149)
VLDL Cholesterol Cal: 16 mg/dL (ref 5–40)

## 2022-11-21 ENCOUNTER — Other Ambulatory Visit: Payer: Self-pay

## 2022-11-23 ENCOUNTER — Other Ambulatory Visit: Payer: Self-pay | Admitting: *Deleted

## 2022-11-23 NOTE — Telephone Encounter (Signed)
LVM  Per Amy,NP This should be filled/managed through his PCP.  (Rosuvastatin)

## 2023-05-05 ENCOUNTER — Telehealth: Payer: Self-pay | Admitting: Psychiatry

## 2023-05-05 NOTE — Telephone Encounter (Signed)
 Pt states appointment not needed

## 2023-05-09 ENCOUNTER — Ambulatory Visit: Payer: Federal, State, Local not specified - PPO | Admitting: Family Medicine

## 2023-10-17 ENCOUNTER — Telehealth: Payer: Self-pay

## 2023-10-17 NOTE — Telephone Encounter (Signed)
 Called [facility] to request that x-ray done of chest be faxed over to office for appointment.

## 2023-10-18 ENCOUNTER — Encounter: Payer: Self-pay | Admitting: Acute Care

## 2023-10-18 ENCOUNTER — Ambulatory Visit: Admitting: Acute Care

## 2023-10-18 VITALS — BP 115/79 | HR 71 | Temp 98.0°F | Ht 70.5 in | Wt 229.4 lb

## 2023-10-18 DIAGNOSIS — R911 Solitary pulmonary nodule: Secondary | ICD-10-CM

## 2023-10-18 DIAGNOSIS — R9389 Abnormal findings on diagnostic imaging of other specified body structures: Secondary | ICD-10-CM

## 2023-10-18 NOTE — Patient Instructions (Addendum)
 It is good to see you today. We have reviewed your CXR. The left upper lobe nodule appears to be the same nodule seen in a 2016 CT Chest . We will do a dedicated CT Chest to better evaluate the nodule. You will get a call to get this scheduled. You will follow up with me 1-2 weeks after the scan to review results. Call if you need us  sooner. Please contact office for sooner follow up if symptoms do not improve or worsen or seek emergency care

## 2023-10-18 NOTE — Progress Notes (Signed)
 History of Present Illness Cristian Lewis is a 68 y.o. male never smoker referred by Roanne [facility]e for evaluation of a left upper lobe pulmonary nodule. He will be followed by Dr. Shelah.   10/18/2023 Discussed the use of AI scribe software for clinical note transcription with the patient, who gave verbal consent to proceed.  History of Present Illness Pt. Presents for follow up of a left upper lobe pulmonary lung nodule. He had a CXR done when 04/2022 working up a cough during an upper respiratory infection and cough. He is a never smoker. He did have secondhand smoke exposure as a child.  His mother did have lung cancer but she was a smoker.  His father had melanoma and lymphoma, and prostate cancer, his brother had prostate cancer and brain cancer. He denies any autoimmune family history.  Looking back at patient's previous imaging history, there is a CT chest done in 2016 by [facility].  This CT chest also showed a left upper lobe granulomatous nodule measuring 5 mm at the time. I suspect this is the same nodule. Pt. Does have a personal history of skin eruptions that come and go ,  sarcoid is definitely on the differential diagnosis list.  He is married. Lives with his wife, has no children. 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 He has an allergy to Sulfa drugs. 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 He had skin cancer in 2014, and Prostate surgery 2014-2015.  We discussed that this may be a sarcoid nodule. Our plan is to do a dedicated CT Chest to better evaluate the nodule. He will follow up with me 1-2 weeks after to review the results.We discussed making sure he gets annual eye exams, and that he is followed by cardiology. He has recently been seen by cardiology. He is also followed by dermatology for the skin eruptions he has on his head. I have encouraged him to follow up with dermatology.  Pt.  Denies any shortness of breath, hemoptysis or unintentional weight loss.      Test Results: CXR 05/10/2022 Normal heart size, mediastinal contours, and pulmonary vascularity. Lungs clear. No pulmonary infiltrate, pleural effusion, or pneumothorax. Eventration RIGHT diaphragm unchanged. Calcified granuloma LEFT upper lobe unchanged. No acute osseous findings.   IMPRESSION: No acute abnormalities.      Latest Ref Rng & Units 07/25/2014    4:18 AM 07/24/2014    2:45 PM 07/23/2014    8:30 AM  CBC  WBC 4.0 - 10.5 K/uL   5.9   Hemoglobin 13.0 - 17.0 g/dL 86.9  86.2  84.6   Hematocrit 39.0 - 52.0 % 38.5  40.1  46.2   Platelets 150 - 400 K/uL   243        Latest Ref Rng & Units 07/23/2014    8:30 AM  BMP  Glucose 65 - 99 mg/dL 98   BUN 6 - 20 mg/dL 15   Creatinine 9.38 - 1.24 mg/dL 9.04   Sodium 864 - 854 mmol/L 139   Potassium 3.5 - 5.1 mmol/L 4.3   Chloride 101 - 111 mmol/L 102   CO2 22 - 32 mmol/L 31   Calcium  8.9 - 10.3 mg/dL 9.4     BNP No results found for: BNP  ProBNP No results found for: PROBNP  PFT No results found for: FEV1PRE, FEV1POST, FVCPRE, FVCPOST, TLC, DLCOUNC, PREFEV1FVCRT, PSTFEV1FVCRT  No results  found.   Past medical hx Past Medical History:  Diagnosis Date   Cancer [facility])    Melanoma   2013   Colon polyp    Diverticulitis    GERD (gastroesophageal reflux disease)    hx of    Headache    Heart murmur    as a child    Hepatitis    hx of HEp A    Prostate cancer (HCC)      Social History   Tobacco Use   Smoking status: Never   Smokeless tobacco: Never  Substance Use Topics   Alcohol use: Yes    Alcohol/week: 1.0 standard drink of alcohol    Types: 1 Glasses of wine per week    Comment: occasional glass of wine    Drug use: Never    Mr.Coye reports that he has never smoked. He has never used smokeless tobacco. He reports current alcohol use of about 1.0 standard drink of alcohol per week. He reports that  he does not use drugs.  Tobacco Cessation: Counseling given: Not Answered Never smoker   Past surgical hx, Family hx, Social hx all reviewed.  Current Outpatient Medications on File Prior to Visit  Medication Sig   cholecalciferol (VITAMIN D3) 25 MCG (1000 UNIT) tablet Take 1,000 Units by mouth daily.   naproxen sodium (ALEVE) 220 MG tablet Take 220 mg by mouth 2 (two) times daily as needed.   Vitamin E (VITAMIN E/D-ALPHA NATURAL) 268 MG (400 UNIT) CAPS 1 capsule   No current facility-administered medications on file prior to visit.     Allergies  Allergen Reactions   Hydroxyzine Hcl     Other reaction(s): Felt terrible   Pravastatin Sodium Other (See Comments)    Other reaction(s): Myalgias   Sulfonamide Derivatives Other (See Comments)    Child hood allergy.     Review Of Systems:  Constitutional:   No  weight loss, night sweats,  Fevers, chills, fatigue, or  lassitude.  HEENT:   No headaches,  Difficulty swallowing,  Tooth/dental problems, or  Sore throat,                No sneezing, itching, ear ache, nasal congestion, post nasal drip,   CV:  No chest pain,  Orthopnea, PND, swelling in lower extremities, anasarca, dizziness, palpitations, syncope.   GI  No heartburn, indigestion, abdominal pain, nausea, vomiting, diarrhea, change in bowel habits, loss of appetite, bloody stools.   Resp: No shortness of breath with exertion or at rest.  No excess mucus, no productive cough,  + non-productive cough,  No coughing up of blood.  No change in color of mucus.  No wheezing.  No chest wall deformity  Skin: no rash or lesions.  GU: no dysuria, change in color of urine, no urgency or frequency.  No flank pain, no hematuria   MS:  No joint pain or swelling.  No decreased range of motion.  No back pain.  Psych:  No change in mood or affect. No depression or anxiety.  No memory loss.   Vital Signs There were no vitals taken for this visit.   Physical Exam:  General- No  distress,  A&Ox3, pleasant ENT: No sinus tenderness, TM clear, pale nasal mucosa, no oral exudate,no post nasal drip, no LAN Cardiac: S1, S2, regular rate and rhythm, no murmur Chest: No wheeze/ rales/ dullness; no accessory muscle use, no nasal flaring, no sternal retractions Abd.: Soft Non-tender, ND, BS +, Body mass index is 32.45  kg/m.  Ext: No clubbing cyanosis, edema, no obvious deformities Neuro:  normal strength, MAE x 4, A&O x 3 Skin: No rashes, warm and dry, small skin lesion on the top of his head. Psych: normal mood and behavior    Assessment & Plan Lung Nodule seen on CXR Hx of dry cough Plan We have reviewed your CXR. The left upper lobe nodule appears to be the same nodule seen in a 2016 CT Chest . We will do a dedicated CT Chest to better evaluate the nodule. You will get a call to get this scheduled. You will follow up with me 1-2 weeks after the scan to review results. Call if you need us  sooner. Please contact office for sooner follow up if symptoms do not improve or worsen or seek emergency care    I spent 25 minutes dedicated to the care of this patient on the date of this encounter to include pre-visit review of records, face-to-face time with the patient discussing conditions above, post visit ordering of testing, clinical documentation with the electronic health record, making appropriate referrals as documented, and communicating necessary information to the patient's healthcare team.      Lauraine JULIANNA Lites, NP 10/18/2023  9:07 AM

## 2023-10-25 ENCOUNTER — Ambulatory Visit
Admission: RE | Admit: 2023-10-25 | Discharge: 2023-10-25 | Disposition: A | Discharge status: Home / Self Care | Source: Ambulatory Visit | Attending: Acute Care | Admitting: Acute Care

## 2023-10-25 DIAGNOSIS — R911 Solitary pulmonary nodule: Secondary | ICD-10-CM

## 2023-11-07 ENCOUNTER — Ambulatory Visit: Admitting: Acute Care

## 2023-11-07 ENCOUNTER — Encounter: Payer: Self-pay | Admitting: Acute Care

## 2023-11-07 VITALS — BP 118/86 | HR 74 | Temp 97.9°F | Ht 70.5 in | Wt 231.8 lb

## 2023-11-07 DIAGNOSIS — R911 Solitary pulmonary nodule: Secondary | ICD-10-CM

## 2023-11-07 DIAGNOSIS — R918 Other nonspecific abnormal finding of lung field: Secondary | ICD-10-CM | POA: Diagnosis not present

## 2023-11-07 DIAGNOSIS — R9389 Abnormal findings on diagnostic imaging of other specified body structures: Secondary | ICD-10-CM

## 2023-11-07 DIAGNOSIS — I712 Thoracic aortic aneurysm, without rupture, unspecified: Secondary | ICD-10-CM | POA: Diagnosis not present

## 2023-11-07 NOTE — Progress Notes (Signed)
 History of Present Illness Cristian Lewis is a 68 y.o. male never smoker referred by Roanne Scott-[facility]e for evaluation of a left upper lobe pulmonary nodule. He will be followed by Dr. Shelah.   Synopsis Cristian Lewis Presents for further follow up of a left upper lobe pulmonary lung nodule. He had a CXR done when 04/2022 working up a cough during an upper respiratory infection . He is a never smoker. He did have secondhand smoke exposure as a child.  His mother did have lung cancer but she was a smoker.  His father had melanoma and lymphoma, and prostate cancer, his brother had prostate cancer and brain cancer. He denies any autoimmune family history.   Looking back at patient's previous imaging history, there is a CT chest done in 2016 by [facility].  This CT chest also showed a left upper lobe granulomatous nodule measuring 5 mm at the time. I suspect this is the same nodule. Pt. Does have a personal history of skin eruptions that come and go ,  sarcoid is definitely on the differential diagnosis list.   He is married. Lives with his wife, has no children. 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 He has an allergy to Sulfa drugs. 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 He had skin cancer in 2014, and Prostate surgery 2014-2015.   We discussed that this may be a sarcoid nodule. Our plan is to do a dedicated CT Chest to better evaluate the nodule. He is here today to follow up after the dedicated CT Chest , to review results and determine follow up/ best plan of care moving forward.      11/07/2023 Discussed the use of AI scribe software for clinical note transcription with the patient, who gave verbal consent to proceed.  History of Present Illness Cristian Lewis is a 68 year old male who presents for evaluation of ground glass nodules and an ascending thoracic aortic aneurysm.  Recent imaging,  including a chest x-ray and CT scan, revealed new ground glass nodules in the left upper lobe, measuring 7 mm and 6 mm, which were present in a previous scan from 2016 but smaller at 5 mm. He has no history of smoking. No symptoms such as hemoptysis or unexplained weight loss.  As these are ground glass nodules, we will follow them closely for stability over the next 5 years. We will do a 6 month follow up CT Chest due in April 2026.   Additionally, imaging studies identified an ascending thoracic aortic aneurysm measuring 4.7 by 4.5 cm. This is the first time he has been informed of this condition, and he has not been following it previously.I will place a referral to thoracic surgery for monitoring. Pt. Does not have a history of HTN.   He recalls having a lingering cough during a previous visit, which has since resolved on its own.This was also concerning in the setting of lung nodules. It is reassuring that it has resolved.      Test Results: CT Chest 10/25/2023 Adjacent ground-glass nodules in the left upper lobe measuring 0.7 cm and 0.6 cm, both new compared to remote prior examination dated 08/06/2014. These are nonspecific and possibly infectious or inflammatory however adenocarcinoma is not excluded. Initial follow-up with CT at 6 months is recommended to confirm persistence. If persistent, repeat CT is recommended every 2 years until 5 years of  stability has been established.  Enlargement of the tubular ascending thoracic aorta measuring up to 4.7 x 4.5 cm. Ascending thoracic aortic aneurysm. Recommend semi-annual imaging followup by CTA or MRA and referral to cardiothoracic surgery if not already obtained.    Latest Ref Rng & Units 07/25/2014    4:18 AM 07/24/2014    2:45 PM 07/23/2014    8:30 AM  CBC  WBC 4.0 - 10.5 K/uL   5.9   Hemoglobin 13.0 - 17.0 g/dL 86.9  86.2  84.6   Hematocrit 39.0 - 52.0 % 38.5  40.1  46.2   Platelets 150 - 400 K/uL   243        Latest Ref Rng &  Units 07/23/2014    8:30 AM  BMP  Glucose 65 - 99 mg/dL 98   BUN 6 - 20 mg/dL 15   Creatinine 9.38 - 1.24 mg/dL 9.04   Sodium 864 - 854 mmol/L 139   Potassium 3.5 - 5.1 mmol/L 4.3   Chloride 101 - 111 mmol/L 102   CO2 22 - 32 mmol/L 31   Calcium  8.9 - 10.3 mg/dL 9.4     BNP No results found for: BNP  ProBNP No results found for: PROBNP  PFT No results found for: FEV1PRE, FEV1POST, FVCPRE, FVCPOST, TLC, DLCOUNC, PREFEV1FVCRT, PSTFEV1FVCRT  CT Chest Wo Contrast Result Date: 10/31/2023 CLINICAL DATA:  Follow-up left upper lobe lung nodule, high cancer risk * Tracking Code: BO * EXAM: CT CHEST WITHOUT CONTRAST TECHNIQUE: Multidetector CT imaging of the chest was performed following the standard protocol without IV contrast. RADIATION DOSE REDUCTION: This exam was performed according to the departmental dose-optimization program which includes automated exposure control, adjustment of the mA and/or kV according to patient size and/or use of iterative reconstruction technique. COMPARISON:  07/07/2014 FINDINGS: Cardiovascular: Enlargement of the tubular ascending thoracic aorta measuring up to 4.7 x 4.5 cm (series 2, image 64). Normal heart size. No pericardial effusion. Mediastinum/Nodes: No enlarged mediastinal, hilar, or axillary lymph nodes. Thyroid gland, trachea, and esophagus demonstrate no significant findings. Lungs/Pleura: Adjacent ground-glass nodules in the left upper lobe measuring 0.7 cm (series 8, image 70) and 0.6 cm (series 8, image 68), both new compared to remote prior examination dated 08/06/2014. Multiple small bilateral benign calcified granulomatous nodules requiring no specific further follow-up or characterization. No pleural effusion or pneumothorax. Upper Abdomen: No acute abnormality. Musculoskeletal: No chest wall abnormality. No acute osseous findings. IMPRESSION: 1. Adjacent ground-glass nodules in the left upper lobe measuring 0.7 cm and 0.6 cm, both  new compared to remote prior examination dated 08/06/2014. These are nonspecific and possibly infectious or inflammatory however adenocarcinoma is not excluded. Initial follow-up with CT at 6 months is recommended to confirm persistence. If persistent, repeat CT is recommended every 2 years until 5 years of stability has been established. This recommendation follows the consensus statement: Guidelines for Management of Incidental Pulmonary Nodules Detected on CT Images: From the Fleischner Society 2017; Radiology 2017; 284:228-243. 2. Enlargement of the tubular ascending thoracic aorta measuring up to 4.7 x 4.5 cm. Ascending thoracic aortic aneurysm. Recommend semi-annual imaging followup by CTA or MRA and referral to cardiothoracic surgery if not already obtained. This recommendation follows 2010 ACCF/AHA/AATS/ACR/ASA/SCA/SCAI/SIR/STS/SVM Guidelines for the Diagnosis and Management of Patients With Thoracic Aortic Disease. Circulation. 2010; 121: Z733-z630. Aortic aneurysm NOS (ICD10-I71.9) Aortic Atherosclerosis (ICD10-I70.0). Electronically Signed   By: Marolyn JONETTA Jaksch M.D.   On: 10/31/2023 21:04     Past medical hx Past Medical History:  Diagnosis Date   Cancer [facility])    Melanoma   2013   Colon polyp    Diverticulitis    GERD (gastroesophageal reflux disease)    hx of    Headache    Heart murmur    as a child    Hepatitis    hx of HEp A    Prostate cancer (HCC)      Social History   Tobacco Use   Smoking status: Never    Passive exposure: Never   Smokeless tobacco: Never  Substance Use Topics   Alcohol use: Yes    Alcohol/week: 1.0 standard drink of alcohol    Types: 1 Glasses of wine per week    Comment: occasional glass of wine    Drug use: Never    CristianMcpartland reports that he has never smoked. He has never been exposed to tobacco smoke. He has never used smokeless tobacco. He reports current alcohol use of about 1.0 standard drink of alcohol per week. He reports that he does  not use drugs.  Tobacco Cessation: Counseling given: Not Answered Never smoker   Past surgical hx, Family hx, Social hx all reviewed.  Current Outpatient Medications on File Prior to Visit  Medication Sig   albuterol (VENTOLIN HFA) 108 (90 Base) MCG/ACT inhaler Inhale 2 puffs into the lungs every 4 (four) hours as needed for wheezing or shortness of breath.   cholecalciferol (VITAMIN D3) 25 MCG (1000 UNIT) tablet Take 1,000 Units by mouth daily.   IVERMECTIN PO    naproxen sodium (ALEVE) 220 MG tablet Take 220 mg by mouth 2 (two) times daily as needed.   NIACIN PO    Vitamin E (VITAMIN E/D-ALPHA NATURAL) 268 MG (400 UNIT) CAPS 1 capsule   No current facility-administered medications on file prior to visit.     Allergies  Allergen Reactions   Hydroxyzine Hcl     Other reaction(s): Felt terrible   Pravastatin Sodium Other (See Comments)    Other reaction(s): Myalgias   Sulfonamide Derivatives Other (See Comments)    Child hood allergy.     Review Of Systems:  Constitutional:   No  weight loss, night sweats,  Fevers, chills, fatigue, or  lassitude.  HEENT:   No headaches,  Difficulty swallowing,  Tooth/dental problems, or  Sore throat,                No sneezing, itching, ear ache, nasal congestion, post nasal drip,   CV:  No chest pain,  Orthopnea, PND, swelling in lower extremities, anasarca, dizziness, palpitations, syncope.   GI  No heartburn, indigestion, abdominal pain, nausea, vomiting, diarrhea, change in bowel habits, loss of appetite, bloody stools.   Resp: No shortness of breath with exertion or at rest.  No excess mucus, no productive cough,  No non-productive cough,  No coughing up of blood.  No change in color of mucus.  No wheezing.  No chest wall deformity  Skin: no rash or lesions.  GU: no dysuria, change in color of urine, no urgency or frequency.  No flank pain, no hematuria   MS:  No joint pain or swelling.  No decreased range of motion.  No back  pain.  Psych:  No change in mood or affect. No depression or anxiety.  No memory loss.   Vital Signs BP 118/86   Pulse 74   Temp 97.9 F (36.6 C) (Temporal)   Ht 5' 10.5 (1.791 m)   Wt 231 lb 12.8 oz (105.1  kg)   SpO2 98%   BMI 32.79 kg/m    Physical Exam:  General- No distress,  A&Ox3, p[leasant ENT: No sinus tenderness, TM clear, pale nasal mucosa, no oral exudate,no post nasal drip, no LAN Cardiac: S1, S2, regular rate and rhythm, no murmur Chest: No wheeze/ rales/ dullness; no accessory muscle use, no nasal flaring, no sternal retractions Abd.: Soft Non-tender,  ND, BS +, Body mass index is 32.79 kg/m.  Ext: No clubbing cyanosis, edema, no obvious deformities Neuro:  normal strength, MAE x 4, A&O x 3, appropriate Skin: No rashes, warm and dry, no obvious skin lesions  Psych: normal mood and behavior    Assessment & Plan Ground glass nodules of left upper lobe of lung New 7 mm and 6 mm partially solid nodules in left upper lobe on CT.  Low malignancy risk due to non-smoking  - Order follow-up CT scan in 6 months. - Educated on signs like hemoptysis or unexplained weight loss to report immediately.  Thoracic aortic aneurysm, without rupture 4.7 x 4.5 cm ascending thoracic aortic aneurysm noted on CT. Asymptomatic, no immediate intervention needed. - Refer to thoracic surgery for evaluation and semiannual imaging follow-up. - Coordinate imaging schedule with thoracic surgery for consistent 81-month monitoring.   I spent 20 minutes dedicated to the care of this patient on the date of this encounter to include pre-visit review of records, face-to-face time with the patient discussing conditions above, post visit ordering of testing, clinical documentation with the electronic health record, making appropriate referrals as documented, and communicating necessary information to the patient's healthcare team.     Lauraine JULIANNA Lites, NP 11/07/2023  10:14 AM

## 2023-11-07 NOTE — Patient Instructions (Addendum)
 It was good to see you today.  We have reviewed your Ct Chest.  There are 2 ground glass lung nodules in the left upper lobe that measure 7 mm and 6 mm that we will need to follow for changes. These are semi solid nodules, as we looked at today.  We will do a 6 month follow up CT Chest  to monitor for any changes. This will be due 04/2024. You will get a call closer to the time to get this scheduled. You will follow up with me 1-2 weeks after the scan to review results. There was an incidental finding of an enlargement of the tubular ascending thoracic aorta. I have made a referral to thoracic surgery to have this monitored and followed.  You should get a call from there office to get a consult scheduled.  They will take great care of you. Call if you need us  sooner, develop any unexplained weight loss, or have any blood in your sputum when you cough.  Call any time if you need us  sooner.  Please contact office for sooner follow up if symptoms do not improve or worsen or seek emergency care . Have a great winter and fall.

## 2023-11-28 ENCOUNTER — Ambulatory Visit
Attending: Thoracic Surgery (Cardiothoracic Vascular Surgery) | Admitting: Thoracic Surgery (Cardiothoracic Vascular Surgery)

## 2023-11-28 ENCOUNTER — Encounter: Payer: Self-pay | Admitting: Thoracic Surgery (Cardiothoracic Vascular Surgery)

## 2023-11-28 VITALS — BP 122/80 | HR 86 | Resp 20 | Ht 70.5 in | Wt 238.0 lb

## 2023-11-28 DIAGNOSIS — I712 Thoracic aortic aneurysm, without rupture, unspecified: Secondary | ICD-10-CM | POA: Insufficient documentation

## 2023-11-28 DIAGNOSIS — I7121 Aneurysm of the ascending aorta, without rupture: Secondary | ICD-10-CM

## 2023-11-28 NOTE — Progress Notes (Signed)
 PCP is Scott-Lowe, Roanne, MD (Inactive) Referring Provider is Ruthell Lauraine FALCON, NP  Chief Complaint  Patient presents with   Thoracic Aortic Aneurysm    New patient consultation, Chest CT 10/1    HPI: Cristian Lewis is sent for consultation regarding and ascending aortic aneurysm.  Cristian Lewis is a 68 year old man with a history of prostate cancer, melanoma, heart murmur as a child, reflux, recently diagnosed Epstein-Barr and long COVID.  First started having problems about 2-1/2 years ago.  Had a prolonged respiratory infection.  Treated with antibiotics and steroids.  Had an episode of double vision at 1 point.  Workup included MRI which showed a chronic lacunar infarct.  Since then has had issues with fatigue and dry cough.  More recently was having some issues with a recurrent respiratory infection.  Went to an urgent care.  Chest x-ray showed a possible left upper lobe nodule and he was referred to pulmonology.  CT of the chest was performed which showed a calcified granuloma in the left upper lobe.  There also 2 ground glass opacities in the left upper lobe.  No adenopathy.  Incidentally noted to have a 4.5 x 4.7 cm ascending aneurysm.  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  Occasionally has to lift up to 50 pounds.  Lifelong non-smoker.  Continues to have issues with fatigue.  Past Medical History:  Diagnosis Date   Cancer [facility])    Melanoma   2013   Colon polyp    Diverticulitis    GERD (gastroesophageal reflux disease)    hx of    Headache    Heart murmur    as a child    Hepatitis    hx of HEp A    Prostate cancer [facility])     Past Surgical History:  Procedure Laterality Date   COLONOSCOPY     2007/2020   left knee surgery      MELANOMA EXCISION     left forearm    ROBOT ASSISTED LAPAROSCOPIC RADICAL PROSTATECTOMY N/A 07/24/2014   Procedure: ROBOTIC ASSISTED LAPAROSCOPIC RADICAL PROSTATECTOMY LEVEL 1;  Surgeon: Gretel Ferrara, MD;  Location: [facility];   Service: Urology;  Laterality: N/A;   WISDOM TOOTH EXTRACTION      Family History  Problem Relation Age of Onset   Stroke Mother    Lung cancer Mother    Melanoma Father    Lymphoma Father    Prostate cancer Father    Prostate cancer Brother    Brain cancer Brother    Hypercholesterolemia Maternal Grandmother     Social History Social History   Tobacco Use   Smoking status: Never    Passive exposure: Never   Smokeless tobacco: Never  Substance Use Topics   Alcohol use: Yes    Alcohol/week: 1.0 standard drink of alcohol    Types: 1 Glasses of wine per week    Comment: occasional glass of wine    Drug use: Never    Current Outpatient Medications  Medication Sig Dispense Refill   albuterol (VENTOLIN HFA) 108 (90 Base) MCG/ACT inhaler Inhale 2 puffs into the lungs every 4 (four) hours as needed for wheezing or shortness of breath.     cholecalciferol (VITAMIN D3) 25 MCG (1000 UNIT) tablet Take 1,000 Units by mouth daily.     IVERMECTIN PO      naproxen sodium (ALEVE) 220 MG tablet Take 220 mg by mouth 2 (two) times daily as needed.     NIACIN  PO      Vitamin E (VITAMIN E/D-ALPHA NATURAL) 268 MG (400 UNIT) CAPS 1 capsule     No current facility-administered medications for this visit.    Allergies  Allergen Reactions   Hydroxyzine Hcl     Other reaction(s): Felt terrible   Pravastatin Sodium Other (See Comments)    Other reaction(s): Myalgias   Sulfonamide Derivatives Other (See Comments)    Child hood allergy.     Review of Systems  Constitutional:  Positive for fatigue.  HENT:  Negative for trouble swallowing.   Eyes:  Positive for visual disturbance (double vision 2 years ago, blurry).  Respiratory:  Positive for cough. Negative for shortness of breath.   Cardiovascular:  Negative for chest pain.  Gastrointestinal:  Positive for abdominal pain (reflux).    BP 122/80   Pulse 86   Resp 20   Ht 5' 10.5 (1.791 m)   Wt 238 lb (108 kg)   SpO2 96% Comment:  RA  BMI 33.67 kg/m  Physical Exam Vitals reviewed.  Constitutional:      General: He is not in acute distress.    Appearance: Normal appearance.  HENT:     Head: Normocephalic and atraumatic.  Eyes:     General: No scleral icterus.    Extraocular Movements: Extraocular movements intact.  Neck:     Vascular: No carotid bruit.  Cardiovascular:     Rate and Rhythm: Normal rate and regular rhythm.     Heart sounds: Normal heart sounds. No murmur heard.    No friction rub. No gallop.  Pulmonary:     Effort: Pulmonary effort is normal. No respiratory distress.     Breath sounds: Normal breath sounds. No wheezing or rales.  Abdominal:     Palpations: Abdomen is soft.  Skin:    General: Skin is warm and dry.     Findings: Lesion (Bandage over scalp lesion) present.  Neurological:     General: No focal deficit present.     Mental Status: He is alert and oriented to person, place, and time.     Cranial Nerves: No cranial nerve deficit.     Motor: No weakness.     Diagnostic Tests: CT CHEST WITHOUT CONTRAST   TECHNIQUE: Multidetector CT imaging of the chest was performed following the standard protocol without IV contrast.   RADIATION DOSE REDUCTION: This exam was performed according to the departmental dose-optimization program which includes automated exposure control, adjustment of the mA and/or kV according to patient size and/or use of iterative reconstruction technique.   COMPARISON:  07/07/2014   FINDINGS: Cardiovascular: Enlargement of the tubular ascending thoracic aorta measuring up to 4.7 x 4.5 cm (series 2, image 64). Normal heart size. No pericardial effusion.   Mediastinum/Nodes: No enlarged mediastinal, hilar, or axillary lymph nodes. Thyroid gland, trachea, and esophagus demonstrate no significant findings.   Lungs/Pleura: Adjacent ground-glass nodules in the left upper lobe measuring 0.7 cm (series 8, image 70) and 0.6 cm (series 8, image 68), both  new compared to remote prior examination dated 08/06/2014. Multiple small bilateral benign calcified granulomatous nodules requiring no specific further follow-up or characterization. No pleural effusion or pneumothorax.   Upper Abdomen: No acute abnormality.   Musculoskeletal: No chest wall abnormality. No acute osseous findings.   IMPRESSION: 1. Adjacent ground-glass nodules in the left upper lobe measuring 0.7 cm and 0.6 cm, both new compared to remote prior examination dated 08/06/2014. These are nonspecific and possibly infectious or inflammatory however adenocarcinoma is  not excluded. Initial follow-up with CT at 6 months is recommended to confirm persistence. If persistent, repeat CT is recommended every 2 years until 5 years of stability has been established. This recommendation follows the consensus statement: Guidelines for Management of Incidental Pulmonary Nodules Detected on CT Images: From the Fleischner Society 2017; Radiology 2017; 284:228-243. 2. Enlargement of the tubular ascending thoracic aorta measuring up to 4.7 x 4.5 cm. Ascending thoracic aortic aneurysm. Recommend semi-annual imaging followup by CTA or MRA and referral to cardiothoracic surgery if not already obtained. This recommendation follows 2010 ACCF/AHA/AATS/ACR/ASA/SCA/SCAI/SIR/STS/SVM Guidelines for the Diagnosis and Management of Patients With Thoracic Aortic Disease. Circulation. 2010; 121: Z733-z630. Aortic aneurysm NOS (ICD10-I71.9)   Aortic Atherosclerosis (ICD10-I70.0).     Electronically Signed   By: Marolyn JONETTA Jaksch M.D.   On: 10/31/2023 21:04 I personally reviewed the CT images.  4.6 cm ascending aneurysm.  7 and 6 mm ground glass opacities left upper lobe.  Impression: Cristian Lewis is a 68 year old man with a history of prostate cancer, melanoma, heart murmur as a child, reflux, recently diagnosed Epstein-Barr and long COVID.  Recently found to have 2 left upper lobe ground glass  opacities and an ascending aortic aneurysm.  Ascending aortic aneurysm-measures about 4.6 cm.  Needs semiannual follow-up.  Blood pressure is within normal range.  No history of hypertension and no family history of aneurysms.  Encouraged aerobic exercise but cautioned him to avoid lifting over 50 pounds.  Will add quinolone antibiotics to medical contraindications.  Could be used in setting where no other alternative antibiotic is acceptable.  Lung nodules-has a calcified granuloma.  No follow-up needed for that.  However, also has 2 ground glass opacities in the left upper lobe.  Those do need follow-up.  Typically would recommend 1 year but he will he be having a CT in 6 months for his aneurysm anyway.  Plan: Return in 6 months with CT angio chest  Spent over 30 minutes in review of records, images, and in consultation with Cristian Lewis today.  Elspeth JAYSON Millers, MD [facility] [phone]

## 2023-12-27 ENCOUNTER — Telehealth: Payer: Self-pay

## 2023-12-27 NOTE — Telephone Encounter (Signed)
 Copied from CRM [id number]. Topic: Clinical - Medical Advice >> Dec 27, 2023 11:35 AM Devaughn RAMAN wrote: Reason for CRM: Pt called and stated his cough has completely stopped and that he was referred to heart surgeon from [facility], pt stated the heart surgeon advised he could monitor the lung nodules as well, pt stated he would like to know if he should continue with heart surgeon for f/u or Sarah. pt would like a f/u regarding this.  Please advise if he should see you to monitor nodules or see the heart surgeon
